# Patient Record
Sex: Male | Born: 1955 | Race: Black or African American | Hispanic: No | Marital: Married | Smoking: Never smoker
Health system: Southern US, Community
[De-identification: ages and names within clinical notes are randomized; demographics above are authoritative.]

## PROBLEM LIST (undated history)

## (undated) DIAGNOSIS — C801 Malignant (primary) neoplasm, unspecified: Secondary | ICD-10-CM

## (undated) DIAGNOSIS — E119 Type 2 diabetes mellitus without complications: Secondary | ICD-10-CM

## (undated) DIAGNOSIS — R519 Headache, unspecified: Secondary | ICD-10-CM

## (undated) HISTORY — PX: OTHER SURGICAL HISTORY: SHX169

---

## 2016-11-21 ENCOUNTER — Ambulatory Visit (HOSPITAL_COMMUNITY)
Admission: EM | Admit: 2016-11-21 | Discharge: 2016-11-21 | Disposition: A | Discharge status: Home / Self Care | Payer: Managed Care, Other (non HMO) | Attending: Physician Assistant | Admitting: Physician Assistant

## 2016-11-21 ENCOUNTER — Encounter (HOSPITAL_COMMUNITY): Payer: Self-pay | Admitting: Emergency Medicine

## 2016-11-21 DIAGNOSIS — Z113 Encounter for screening for infections with a predominantly sexual mode of transmission: Secondary | ICD-10-CM | POA: Diagnosis not present

## 2016-11-21 DIAGNOSIS — Z202 Contact with and (suspected) exposure to infections with a predominantly sexual mode of transmission: Secondary | ICD-10-CM | POA: Insufficient documentation

## 2016-11-21 MED ORDER — METRONIDAZOLE 500 MG PO TABS
ORAL_TABLET | ORAL | Status: AC
  Filled 2016-11-21: qty 4

## 2016-11-21 MED ORDER — METRONIDAZOLE 500 MG PO TABS
2000.0000 mg | ORAL_TABLET | Freq: Once | ORAL | Status: AC
  Administered 2016-11-21: 2000 mg via ORAL

## 2016-11-21 NOTE — ED Provider Notes (Signed)
Alexander Brady    CSN: [id number] Arrival date & time: 11/21/16  1146     History   Chief Complaint Chief Complaint  Patient presents with  . Exposure to STD    HPI Alexander Brady is a 61 y.o. male.   61 year-old male, presenting today for STD check. Wife is here with the patient and she states that she was told this morning she tested positive for trichomonas. Her symptoms started in the past week. Patient denies any penile discharge, rash or lesions. No abdominal pain, dysuria or flank pain The patient states he is only sexually active with his wife and the wife states the same    The history is provided by the patient and the spouse.  Exposure to STD  This is a new problem. The current episode started 1 to 2 hours ago. The problem occurs constantly. The problem has not changed since onset.Pertinent negatives include no chest pain, no abdominal pain, no headaches and no shortness of breath. Nothing aggravates the symptoms. Nothing relieves the symptoms. He has tried nothing for the symptoms. The treatment provided no relief.    History reviewed. No pertinent past medical history.  There are no active problems to display for this patient.   History reviewed. No pertinent surgical history.     Home Medications    Prior to Admission medications   Not on File    Family History No family history on file.  Social History Social History  Substance Use Topics  . Smoking status: Not on file  . Smokeless tobacco: Not on file  . Alcohol use Not on file     Allergies   Patient has no known allergies.   Review of Systems Review of Systems  Constitutional: Negative for chills and fever.  HENT: Negative for ear pain and sore throat.   Eyes: Negative for pain and visual disturbance.  Respiratory: Negative for cough and shortness of breath.   Cardiovascular: Negative for chest pain and palpitations.  Gastrointestinal: Negative for abdominal pain and  vomiting.  Genitourinary: Negative for dysuria and hematuria.  Musculoskeletal: Negative for arthralgias and back pain.  Skin: Negative for color change and rash.  Neurological: Negative for seizures, syncope and headaches.  All other systems reviewed and are negative.    Physical Exam Triage Vital Signs ED Triage Vitals [11/21/16 1306]  Enc Vitals Group     BP 134/80     Pulse Rate 73     Resp 16     Temp 98.5 F (36.9 C)     Temp Source Oral     SpO2 100 %     Weight      Height      Head Circumference      Peak Flow      Pain Score      Pain Loc      Pain Edu?      Excl. in [place]?    No data found.   Updated Vital Signs BP 134/80   Pulse 73   Temp 98.5 F (36.9 C) (Oral)   Resp 16   SpO2 100%   Visual Acuity Right Eye Distance:   Left Eye Distance:   Bilateral Distance:    Right Eye Near:   Left Eye Near:    Bilateral Near:     Physical Exam  Constitutional: He appears well-developed and well-nourished.  HENT:  Head: Normocephalic and atraumatic.  Eyes: Conjunctivae are normal.  Neck: Neck supple.  Cardiovascular:  Normal rate and regular rhythm.   No murmur heard. Pulmonary/Chest: Effort normal and breath sounds normal. No respiratory distress.  Abdominal: Soft. There is no tenderness.  Musculoskeletal: He exhibits no edema.  Neurological: He is alert.  Skin: Skin is warm and dry.  Psychiatric: He has a normal mood and affect.  Nursing note and vitals reviewed.    UC Treatments / Results  Labs (all labs ordered are listed, but only abnormal results are displayed) Labs Reviewed  URINE CYTOLOGY ANCILLARY ONLY    EKG  EKG Interpretation None       Radiology No results found.  Procedures Procedures (including critical care time)  Medications Ordered in UC Medications  metroNIDAZOLE (FLAGYL) tablet 2,000 mg (not administered)     Initial Impression / Assessment and Plan / UC Course  I have reviewed the triage vital signs and the  nursing notes.  Pertinent labs & imaging results that were available during my care of the patient were reviewed by me and considered in my medical decision making (see chart for details).     Patient requesting testing for trichomonas. He would like to be treated empirically while here. Given 2g of oral flagyl, one time dose. He is asymptomatic   Final Clinical Impressions(s) / UC Diagnoses   Final diagnoses:  Exposure to STD    New Prescriptions New Prescriptions   No medications on file     Controlled Substance Prescriptions [place] Controlled Substance Registry consulted? Not Applicable   Phebe Colla, [place] 11/21/16 1321

## 2016-11-21 NOTE — ED Triage Notes (Signed)
Pt states "i want to get checked for trichomonas". Pt denies symptoms.  Pt wife states she got a phone call that she has trichomonas.

## 2016-11-22 LAB — URINE CYTOLOGY ANCILLARY ONLY
Chlamydia: NEGATIVE
Neisseria Gonorrhea: NEGATIVE
Trichomonas: NEGATIVE

## 2017-04-04 ENCOUNTER — Encounter: Payer: Self-pay | Admitting: Internal Medicine

## 2017-04-24 ENCOUNTER — Ambulatory Visit: Payer: Managed Care, Other (non HMO)

## 2017-04-26 ENCOUNTER — Ambulatory Visit (INDEPENDENT_AMBULATORY_CARE_PROVIDER_SITE_OTHER): Payer: Self-pay

## 2017-04-26 DIAGNOSIS — Z1211 Encounter for screening for malignant neoplasm of colon: Secondary | ICD-10-CM

## 2017-04-26 MED ORDER — PEG 3350-KCL-NABCB-NACL-NASULF 236 G PO SOLR: 4000.0000 mL | Freq: Once | ORAL | 0 refills | Status: AC

## 2017-04-26 NOTE — Progress Notes (Signed)
Gastroenterology Pre-Procedure Review  Request Date:04/26/17 Requesting Physician: Thornton Papas NP [facility] ( pt stated he thinks he had a tcs "many many" years ago in [place] but hes not sure)  PATIENT REVIEW QUESTIONS: The patient responded to the following health history questions as indicated:    1. Diabetes Melitis: no 2. Joint replacements in the past 12 months: no 3. Major health problems in the past 3 months: no 4. Has an artificial valve or MVP: no 5. Has a defibrillator: no 6. Has been advised in past to take antibiotics in advance of a procedure like teeth cleaning: no 7. Family history of colon cancer: no  8. Alcohol Use: no 9. History of sleep apnea: no  10. History of coronary artery or other vascular stents placed within the last 12 months: no 11. History of any prior anesthesia complications: no    MEDICATIONS & ALLERGIES:    Patient reports the following regarding taking any blood thinners:   Plavix? no Aspirin? no Coumadin? no Brilinta? no Xarelto? no Eliquis? no Pradaxa? no Savaysa? no Effient? no  Patient confirms/reports the following medications:  No current outpatient medications on file.   No current facility-administered medications for this visit.     Patient confirms/reports the following allergies:  No Known Allergies  No orders of the defined types were placed in this encounter.   AUTHORIZATION INFORMATION Primary Insurance: [facility] / Auth required: yes Pre-Cert / Auth #: paperwork has been scanned in.    SCHEDULE INFORMATION: Procedure has been scheduled as follows:  Date: , Time:  Location: [facility]  This Gastroenterology Pre-Precedure Review Form is being routed to the following provider(s): Roseanne Kaufman NP

## 2017-04-26 NOTE — Progress Notes (Signed)
Patient has to check with his wife about dates and times and will call me back. See phone note.

## 2017-04-27 ENCOUNTER — Telehealth: Payer: Self-pay

## 2017-04-27 NOTE — Telephone Encounter (Signed)
Pt called asking for JL. He had dates to give her for his colonoscopy. I told him that JL was with another patient and would call him back if he would leave a VM for her. Pt refused. He said to have JL call him today at [phone]

## 2017-04-27 NOTE — Telephone Encounter (Signed)
Spoke with the pt, we discussed dates, he will call me back on Monday when he confirms with his wife. I have put him on the schedule for 06/15/17 at 10:30 with [facility]. I have not put the orders in yet. Rx will need to be faxed to the [place] and instructions mailed to the pt once he confirms this date and time.

## 2017-04-28 NOTE — Progress Notes (Signed)
Appropriate.

## 2017-05-02 NOTE — Telephone Encounter (Signed)
Spoke with the pt, date and time ok, instructions mailed to the pt and rx has been faxed to the [place]. Pt is aware.

## 2017-05-02 NOTE — Progress Notes (Signed)
Called pt- he is ok with the date and time we decided on and instructions are done and mailed to the pt. rx has been faxed to the [place]. Pt is aware.

## 2017-05-02 NOTE — Patient Instructions (Addendum)
Alexander Brady   Aug 07, 1955 MRN: [id number]    Procedure Date: 06/15/17 Time to register: 9:30am Place to register: [facility] Procedure Time: 10:30am Scheduled provider: Barney Drain, MD  PREPARATION FOR COLONOSCOPY WITH TRI-LYTE SPLIT PREP  Please notify us immediately if you are diabetic, take iron supplements, or if you are on Coumadin or any other blood thinners.     You will need to purchase 1 fleet enema and 1 box of Bisacodyl '5mg'$  tablets.   1 DAY BEFORE PROCEDURE:  DATE: 06/14/17   DAY: Wednesday  clear liquids the entire day - NO SOLID FOOD.     At 2:00 pm:  Take 2 Bisacodyl tablets.   At 4:00pm:  Start drinking your solution. Make sure you mix well per instructions on the bottle. Try to drink 1 (one) 8 ounce glass every 10-15 minutes until you have consumed HALF the jug. You should complete by 6:00pm.You must keep the left over solution refrigerated until completed next day.  Continue clear liquids. You must drink plenty of clear liquids to prevent dehyration and kidney failure. Nothing to eat or drink after midnight.  EXCEPTION: If you take medications for your heart, blood pressure or breathing, you may take these medications with a small amount of clear liquid.    DAY OF PROCEDURE:   DATE: 06/15/17   DAY: Thursday    Five hours before your procedure time @ 5:30am:  Finish remaining amout of bowel prep, drinking 1 (one) 8 ounce glass every 10-15 minutes until complete. You have two hours to consume remaining prep.   Three hours before your procedure time '@7'$ :30am:  Nothing by mouth.   At least one hour before going to the hospital:  Give yourself one Fleet enema. You may take your morning medications with sip of water unless we have instructed otherwise.      Please see below for Dietary Information.  CLEAR LIQUIDS INCLUDE:  Water Jello (NOT red in color)   Ice Popsicles (NOT red in color)   Tea (sugar ok, no milk/cream) Powdered fruit flavored  drinks  Coffee (sugar ok, no milk/cream) Gatorade/ Lemonade/ Kool-Aid  (NOT red in color)   Juice: apple, white grape, white cranberry Soft drinks  Clear bullion, consomme, broth (fat free beef/chicken/vegetable)  Carbonated beverages (any kind)  Strained chicken noodle soup Hard Candy   Remember: Clear liquids are liquids that will allow you to see your fingers on the other side of a clear glass. Be sure liquids are NOT red in color, and not cloudy, but CLEAR.  DO NOT EAT OR DRINK ANY OF THE FOLLOWING:  Dairy products of any kind   Cranberry juice Tomato juice / V8 juice   Grapefruit juice Orange juice     Red grape juice  Do not eat any solid foods, including such foods as: cereal, oatmeal, yogurt, fruits, vegetables, creamed soups, eggs, bread, crackers, pureed foods in a blender, etc.   HELPFUL HINTS FOR DRINKING PREP SOLUTION:   Make sure prep is extremely cold. Mix and refrigerate the the morning of the prep. You may also put in the freezer.   You may try mixing some Crystal Light or Country Time Lemonade if you prefer. Mix in small amounts; add more if necessary.  Try drinking through a straw  Rinse mouth with water or a mouthwash between glasses, to remove after-taste.  Try sipping on a cold beverage /ice/ popsicles between glasses of prep.  Place a piece of sugar-free hard candy in mouth  between glasses.  If you become nauseated, try consuming smaller amounts, or stretch out the time between glasses. Stop for 30-60 minutes, then slowly start back drinking.        OTHER INSTRUCTIONS  You will need a responsible adult at least 61 years of age to accompany you and drive you home. This person must remain in the waiting room during your procedure. The hospital will cancel your procedure if you do not have a responsible adult with you.   1. Wear loose fitting clothing that is easily removed. 2. Leave jewelry and other valuables at home.  3. Remove all body piercing  jewelry and leave at home. 4. Total time from sign-in until discharge is approximately 2-3 hours. 5. You should go home directly after your procedure and rest. You can resume normal activities the day after your procedure. 6. The day of your procedure you should not:  Drive  Make legal decisions  Operate machinery  Drink alcohol  Return to work   You may call the office (Dept: [phone]) before 5:00pm, or page the doctor on call [phone]) after 5:00pm, for further instructions, if necessary.   Insurance Information YOU WILL NEED TO CHECK WITH YOUR INSURANCE COMPANY FOR THE BENEFITS OF COVERAGE YOU HAVE FOR THIS PROCEDURE.  UNFORTUNATELY, NOT ALL INSURANCE COMPANIES HAVE BENEFITS TO COVER ALL OR PART OF THESE TYPES OF PROCEDURES.  IT IS YOUR RESPONSIBILITY TO CHECK YOUR BENEFITS, HOWEVER, WE WILL BE GLAD TO ASSIST YOU WITH ANY CODES YOUR INSURANCE COMPANY MAY NEED.    PLEASE NOTE THAT MOST INSURANCE COMPANIES WILL NOT COVER A SCREENING COLONOSCOPY FOR PEOPLE UNDER THE AGE OF 50  IF YOU HAVE BCBS INSURANCE, YOU MAY HAVE BENEFITS FOR A SCREENING COLONOSCOPY BUT IF POLYPS ARE FOUND THE DIAGNOSIS WILL CHANGE AND THEN YOU MAY HAVE A DEDUCTIBLE THAT WILL NEED TO BE MET. SO PLEASE MAKE SURE YOU CHECK YOUR BENEFITS FOR A SCREENING COLONOSCOPY AS WELL AS A DIAGNOSTIC COLONOSCOPY.

## 2017-05-02 NOTE — Addendum Note (Signed)
Addended by: Claudina Lick on: 05/02/2017 04:59 PM   Modules accepted: Orders, SmartSet

## 2017-06-15 ENCOUNTER — Other Ambulatory Visit: Payer: Self-pay

## 2017-06-15 ENCOUNTER — Ambulatory Visit (HOSPITAL_COMMUNITY)
Admission: RE | Admit: 2017-06-15 | Discharge: 2017-06-15 | Disposition: A | Discharge status: Home / Self Care | Payer: Non-veteran care | Source: Ambulatory Visit | Attending: Gastroenterology | Admitting: Gastroenterology

## 2017-06-15 ENCOUNTER — Encounter (HOSPITAL_COMMUNITY): Payer: Self-pay | Admitting: *Deleted

## 2017-06-15 ENCOUNTER — Encounter (HOSPITAL_COMMUNITY)
Admission: RE | Disposition: A | Discharge status: Home / Self Care | Payer: Self-pay | Source: Ambulatory Visit | Attending: Gastroenterology

## 2017-06-15 DIAGNOSIS — Z1211 Encounter for screening for malignant neoplasm of colon: Secondary | ICD-10-CM | POA: Diagnosis not present

## 2017-06-15 DIAGNOSIS — K644 Residual hemorrhoidal skin tags: Secondary | ICD-10-CM | POA: Insufficient documentation

## 2017-06-15 HISTORY — PX: COLONOSCOPY: SHX5424

## 2017-06-15 SURGERY — COLONOSCOPY
Anesthesia: Moderate Sedation

## 2017-06-15 MED ORDER — MIDAZOLAM HCL 5 MG/5ML IJ SOLN
INTRAMUSCULAR | Status: AC
  Filled 2017-06-15: qty 10

## 2017-06-15 MED ORDER — MEPERIDINE HCL 100 MG/ML IJ SOLN
INTRAMUSCULAR | Status: AC
  Filled 2017-06-15: qty 2

## 2017-06-15 MED ORDER — MEPERIDINE HCL 100 MG/ML IJ SOLN
INTRAMUSCULAR | Status: DC | PRN
  Administered 2017-06-15: 25 mg
  Administered 2017-06-15: 50 mg

## 2017-06-15 MED ORDER — STERILE WATER FOR IRRIGATION IR SOLN
Status: DC | PRN
  Administered 2017-06-15: 10:00:00

## 2017-06-15 MED ORDER — SODIUM CHLORIDE 0.9 % IV SOLN: INTRAVENOUS | Status: DC

## 2017-06-15 MED ORDER — MIDAZOLAM HCL 5 MG/5ML IJ SOLN
INTRAMUSCULAR | Status: DC | PRN
  Administered 2017-06-15 (×2): 2 mg via INTRAVENOUS

## 2017-06-15 MED ORDER — SODIUM CHLORIDE 0.9 % IV SOLN
INTRAVENOUS | Status: AC | PRN
  Administered 2017-06-15: 1000 mL via INTRAVENOUS

## 2017-06-15 NOTE — H&P (Signed)
  Primary Care Physician:  [facility] Primary Gastroenterologist:  Dr. Oneida Alar  Pre-Procedure History & Physical: HPI:  Alexander Brady is a 62 y.o. male here for [place].  History reviewed. No pertinent past medical history.  Past Surgical History:  Procedure Laterality Date  . NONE      Prior to Admission medications   Medication Sig Start Date End Date Taking? Authorizing Provider  Multiple Vitamins-Minerals (MULTIVITAMIN WITH MINERALS) tablet Take 1 tablet by mouth daily.   Yes [provider]    Allergies as of 05/02/2017  . (No Known Allergies)    Family History  Problem Relation Age of Onset  . Leukemia Mother   . Prostate cancer Brother   . Colon cancer Neg Hx   . Colon polyps Neg Hx     Social History   Socioeconomic History  . Marital status: Married    Spouse name: Not on file  . Number of children: Not on file  . Years of education: Not on file  . Highest education level: Not on file  Occupational History  . Not on file  Social Needs  . Financial resource strain: Not on file  . Food insecurity:    Worry: Not on file    Inability: Not on file  . Transportation needs:    Medical: Not on file    Non-medical: Not on file  Tobacco Use  . Smoking status: Never Smoker  . Smokeless tobacco: Never Used  Substance and Sexual Activity  . Alcohol use: Never    Frequency: Never  . Drug use: Never  . Sexual activity: Not on file  Lifestyle  . Physical activity:    Days per week: Not on file    Minutes per session: Not on file  . Stress: Not on file  Relationships  . Social connections:    Talks on phone: Not on file    Gets together: Not on file    Attends religious service: Not on file    Active member of club or organization: Not on file    Attends meetings of clubs or organizations: Not on file    Relationship status: Not on file  . Intimate partner violence:    Fear of current or ex partner: Not on file   Emotionally abused: Not on file    Physically abused: Not on file    Forced sexual activity: Not on file  Other Topics Concern  . Not on file  Social History Narrative  . Not on file    Review of Systems: See HPI, otherwise negative ROS   Physical Exam: BP 124/74   Pulse 61   Temp 98 F (36.7 C) (Oral)   Resp 13   SpO2 100%  General:   Alert,  pleasant and cooperative in NAD Head:  Normocephalic and atraumatic. Neck:  Supple; Lungs:  Clear throughout to auscultation.    Heart:  Regular rate and rhythm. Abdomen:  Soft, nontender and nondistended. Normal bowel sounds, without guarding, and without rebound.   Neurologic:  Alert and  oriented x4;  grossly normal neurologically.  Impression/Plan:     SCREENING  Plan:  1. TCS TODAY. DISCUSSED PROCEDURE, BENEFITS, & RISKS: < 1% chance of medication reaction, bleeding, perforation, or rupture of spleen/liver.

## 2017-06-15 NOTE — Op Note (Addendum)
[facility] Patient Name: Alexander Brady Procedure Date: 06/15/2017 10:22 AM MRN: [id number] Date of Birth: 1955-05-17 Attending MD: Barney Drain MD, MD CSN: [id number] Age: 62 Admit Type: Outpatient Procedure:                Colonoscopy, SCREENING Indications:              Screening for colorectal malignant neoplasm Providers:                Barney Drain MD, MD, Janeece Riggers, RN, Nelma Rothman,                            Technician Referring MD:             [facility] Medicines:                Meperidine 75 mg IV, Midazolam 4 mg IV Complications:            No immediate complications. Estimated Blood Loss:     Estimated blood loss: none. Procedure:                Pre-Anesthesia Assessment:                           - Prior to the procedure, a History and Physical                            was performed, and patient medications and                            allergies were reviewed. The patient's tolerance of                            previous anesthesia was also reviewed. The risks                            and benefits of the procedure and the sedation                            options and risks were discussed with the patient.                            All questions were answered, and informed consent                            was obtained. Prior Anticoagulants: The patient has                            taken no previous anticoagulant or antiplatelet                            agents. ASA Grade Assessment: I - A normal, healthy                            patient. After reviewing the risks and benefits,  the patient was deemed in satisfactory condition to                            undergo the procedure. After obtaining informed                            consent, the colonoscope was passed under direct                            vision. Throughout the procedure, the patient's                            blood pressure, pulse, and oxygen  saturations were                            monitored continuously. The EC-[id number] ([id number])                            scope was introduced through the anus and advanced                            to the the cecum, identified by appendiceal orifice                            and ileocecal valve. The colonoscopy was                            technically difficult and complex due to                            significant looping. Successful completion of the                            procedure was aided by straightening and shortening                            the scope to obtain bowel loop reduction and                            COLOWRAP. The patient tolerated the procedure                            fairly well. The quality of the bowel preparation                            was excellent. The ileocecal valve, appendiceal                            orifice, and rectum were photographed. Scope In: 10:36:58 AM Scope Out: 10:49:16 AM Scope Withdrawal Time: 0 hours 9 minutes 11 seconds  Total Procedure Duration: 0 hours 12 minutes 18 seconds  Findings:      The recto-sigmoid colon, sigmoid colon and descending colon revealed       moderately excessive looping.  The exam was otherwise without abnormality.      External hemorrhoids were found during retroflexion. The hemorrhoids       were moderate. Impression:               - There was significant looping of the colon.                           - The examination was otherwise normal.                           - External hemorrhoids. Moderate Sedation:      Moderate (conscious) sedation was administered by the endoscopy nurse       and supervised by the endoscopist. The following parameters were       monitored: oxygen saturation, heart rate, blood pressure, and response       to care. Total physician intraservice time was 29 minutes. Recommendation:           - Repeat colonoscopy in 10 years for surveillance                             WITH COLOWRAP.                           - High fiber diet.                           - Continue present medications.                           - Patient has a contact number available for                            emergencies. The signs and symptoms of potential                            delayed complications were discussed with the                            patient. Return to normal activities tomorrow.                            Written discharge instructions were provided to the                            patient. Procedure Code(s):        --- Professional ---                           [phone], Colonoscopy, flexible; diagnostic, including                            collection of specimen(s) by brushing or washing,                            when performed (separate procedure)  G0500, Moderate sedation services provided by the                            same physician or other qualified health care                            professional performing a gastrointestinal                            endoscopic service that sedation supports,                            requiring the presence of an independent trained                            observer to assist in the monitoring of the                            patient's level of consciousness and physiological                            status; initial 15 minutes of intra-service time;                            patient age 51 years or older (additional time may                            be reported with [phone], as appropriate)                           [id number], Moderate sedation services provided by the                            same physician or other qualified health care                            professional performing the diagnostic or                            therapeutic service that the sedation supports,                            requiring the presence of an independent trained                             observer to assist in the monitoring of the                            patient's level of consciousness and physiological                            status; each additional 15 minutes intraservice  time (List separately in addition to code for                            primary service) Diagnosis Code(s):        --- Professional ---                           Z12.11, Encounter for screening for malignant                            neoplasm of colon                           K64.4, Residual hemorrhoidal skin tags CPT copyright 2017 American Medical Association. All rights reserved. The codes documented in this report are preliminary and upon coder review may  be revised to meet current compliance requirements. Barney Drain, MD Barney Drain MD, MD 06/15/2017 10:58:38 AM This report has been signed electronically. Number of Addenda: 0

## 2017-06-15 NOTE — Discharge Instructions (Signed)
You have moderate size external hemorrhoids. YOU DID NOT HAVE ANY POLYPS.   DRINK WATER TO KEEP YOUR URINE LIGHT YELLOW.  To reduce your risk of colon cancer, FOLLOW A HIGH FIBER DIET. AVOID ITEMS THAT CAUSE BLOATING. SEE INFO BELOW.  USE PREPARATION H FOUR TIMES  A DAY IF NEEDED TO RELIEVE RECTAL PAIN/PRESSURE/BLEEDING.  Next colonoscopy in 10 years.  Colonoscopy Care After Read the instructions outlined below and refer to this sheet in the next week. These discharge instructions provide you with general information on caring for yourself after you leave the hospital. While your treatment has been planned according to the most current medical practices available, unavoidable complications occasionally occur. If you have any problems or questions after discharge, call DR. Amica Harron, [phone].  ACTIVITY  You may resume your regular activity, but move at a slower pace for the next 24 hours.   Take frequent rest periods for the next 24 hours.   Walking will help get rid of the air and reduce the bloated feeling in your belly (abdomen).   No driving for 24 hours (because of the medicine (anesthesia) used during the test).   You may shower.   Do not sign any important legal documents or operate any machinery for 24 hours (because of the anesthesia used during the test).    NUTRITION  Drink plenty of fluids.   You may resume your normal diet as instructed by your doctor.   Begin with a light meal and progress to your normal diet. Heavy or fried foods are harder to digest and may make you feel sick to your stomach (nauseated).   Avoid alcoholic beverages for 24 hours or as instructed.    MEDICATIONS  You may resume your normal medications.   WHAT YOU CAN EXPECT TODAY  Some feelings of bloating in the abdomen.   Passage of more gas than usual.   Spotting of blood in your stool or on the toilet paper  .  IF YOU HAD POLYPS REMOVED DURING THE COLONOSCOPY:  Eat a soft  diet IF YOU HAVE NAUSEA, BLOATING, ABDOMINAL PAIN, OR VOMITING.    FINDING OUT THE RESULTS OF YOUR TEST Not all test results are available during your visit. DR. Oneida Alar WILL CALL YOU WITHIN 14 DAYS OF YOUR PROCEDUE WITH YOUR RESULTS. Do not assume everything is normal if you have not heard from DR. Merrianne Mccumbers, CALL HER OFFICE AT [phone].  SEEK IMMEDIATE MEDICAL ATTENTION AND CALL THE OFFICE: [phone] IF:  You have more than a spotting of blood in your stool.   Your belly is swollen (abdominal distention).   You are nauseated or vomiting.   You have a temperature over 101F.   You have abdominal pain or discomfort that is severe or gets worse throughout the day.  High-Fiber Diet A high-fiber diet changes your normal diet to include more whole grains, legumes, fruits, and vegetables. Changes in the diet involve replacing refined carbohydrates with unrefined foods. The calorie level of the diet is essentially unchanged. The Dietary Reference Intake (recommended amount) for adult males is 38 grams per day. For adult females, it is 25 grams per day. Pregnant and lactating women should consume 28 grams of fiber per day. Fiber is the intact part of a plant that is not broken down during digestion. Functional fiber is fiber that has been isolated from the plant to provide a beneficial effect in the body. PURPOSE  Increase stool bulk.   Ease and regulate bowel movements.   Lower  cholesterol.   REDUCE RISK OF COLON CANCER  INDICATIONS THAT YOU NEED MORE FIBER  Constipation and hemorrhoids.   Uncomplicated diverticulosis (intestine condition) and irritable bowel syndrome.   Weight management.   As a protective measure against hardening of the arteries (atherosclerosis), diabetes, and cancer.   GUIDELINES FOR INCREASING FIBER IN THE DIET  Start adding fiber to the diet slowly. A gradual increase of about 5 more grams (2 slices of whole-wheat bread, 2 servings of most fruits or  vegetables, or 1 bowl of high-fiber cereal) per day is best. Too rapid an increase in fiber may result in constipation, flatulence, and bloating.   Drink enough water and fluids to keep your urine clear or pale yellow. Water, juice, or caffeine-free drinks are recommended. Not drinking enough fluid may cause constipation.   Eat a variety of high-fiber foods rather than one type of fiber.   Try to increase your intake of fiber through using high-fiber foods rather than fiber pills or supplements that contain small amounts of fiber.   The goal is to change the types of food eaten. Do not supplement your present diet with high-fiber foods, but replace foods in your present diet.    INCLUDE A VARIETY OF FIBER SOURCES  Replace refined and processed grains with whole grains, canned fruits with fresh fruits, and incorporate other fiber sources. White rice, white breads, and most bakery goods contain little or no fiber.   Brown whole-grain rice, buckwheat oats, and many fruits and vegetables are all good sources of fiber. These include: broccoli, Brussels sprouts, cabbage, cauliflower, beets, sweet potatoes, white potatoes (skin on), carrots, tomatoes, eggplant, squash, berries, fresh fruits, and dried fruits.   Cereals appear to be the richest source of fiber. Cereal fiber is found in whole grains and bran. Bran is the fiber-rich outer coat of cereal grain, which is largely removed in refining. In whole-grain cereals, the bran remains. In breakfast cereals, the largest amount of fiber is found in those with "bran" in their names. The fiber content is sometimes indicated on the label.   You may need to include additional fruits and vegetables each day.   In baking, for 1 cup white flour, you may use the following substitutions:   1 cup whole-wheat flour minus 2 tablespoons.   1/2 cup white flour plus 1/2 cup whole-wheat flour.   Hemorrhoids Hemorrhoids are dilated (enlarged) veins around the  rectum. Sometimes clots will form in the veins. This makes them swollen and painful. These are called thrombosed hemorrhoids. Causes of hemorrhoids include:  Constipation.   Straining to have a bowel movement.   HEAVY LIFTING  HOME CARE INSTRUCTIONS  Eat a well balanced diet and drink 6 to 8 glasses of water every day to avoid constipation. You may also use a bulk laxative.   Avoid straining to have bowel movements.   Keep anal area dry and clean.   Do not use a donut shaped pillow or sit on the toilet for long periods. This increases blood pooling and pain.   Move your bowels when your body has the urge; this will require less straining and will decrease pain and pressure.

## 2017-06-20 ENCOUNTER — Encounter (HOSPITAL_COMMUNITY): Payer: Self-pay | Admitting: Gastroenterology

## 2017-07-23 ENCOUNTER — Emergency Department (HOSPITAL_COMMUNITY)
Admission: EM | Admit: 2017-07-23 | Discharge: 2017-07-23 | Disposition: A | Discharge status: Home / Self Care | Payer: Managed Care, Other (non HMO) | Attending: Emergency Medicine | Admitting: Emergency Medicine

## 2017-07-23 ENCOUNTER — Encounter (HOSPITAL_COMMUNITY): Payer: Self-pay | Admitting: Emergency Medicine

## 2017-07-23 ENCOUNTER — Other Ambulatory Visit: Payer: Self-pay

## 2017-07-23 DIAGNOSIS — L089 Local infection of the skin and subcutaneous tissue, unspecified: Secondary | ICD-10-CM | POA: Insufficient documentation

## 2017-07-23 DIAGNOSIS — L910 Hypertrophic scar: Secondary | ICD-10-CM | POA: Insufficient documentation

## 2017-07-23 DIAGNOSIS — M542 Cervicalgia: Secondary | ICD-10-CM | POA: Diagnosis present

## 2017-07-23 MED ORDER — DOXYCYCLINE HYCLATE 100 MG PO TABS
100.0000 mg | ORAL_TABLET | Freq: Once | ORAL | Status: AC
  Administered 2017-07-23: 100 mg via ORAL
  Filled 2017-07-23: qty 1

## 2017-07-23 MED ORDER — CEFTRIAXONE SODIUM 1 G IJ SOLR
1.0000 g | Freq: Once | INTRAMUSCULAR | Status: AC
  Administered 2017-07-23: 1 g via INTRAMUSCULAR
  Filled 2017-07-23: qty 10

## 2017-07-23 MED ORDER — DOXYCYCLINE HYCLATE 100 MG PO CAPS: 100.0000 mg | ORAL_CAPSULE | Freq: Two times a day (BID) | ORAL | 0 refills | Status: DC

## 2017-07-23 NOTE — ED Provider Notes (Signed)
[facility] EMERGENCY DEPARTMENT Provider Note   CSN: [id number] Arrival date & time: 07/23/17  0818     History   Chief Complaint Chief Complaint  Patient presents with  . Neck Pain    HPI Alexander Brady is a 62 y.o. male.  Patient is a 62 year old male who presents to the emergency department with a complaint of problem with his neck.  The patient states that at age 44 he sustained a laceration to his neck.  He took this to the sutures out himself and did not allow it to completely heal, he developed keloid scarring.  And has had problems with it since that time.  Most recently he has been having issues with drainage and pain around the neck area.  He says at times the drainage has an odor to it.  He says there are at least 2 areas that seem to be draining on his neck mostly at the edges of the keloid scarring.  He has not had fever or chills.  He has had not had nausea or vomiting.  He has not noted any red streaks.     History reviewed. No pertinent past medical history.  Patient Active Problem List   Diagnosis Date Noted  . Special screening for malignant neoplasms, colon     Past Surgical History:  Procedure Laterality Date  . COLONOSCOPY N/A 06/15/2017   Procedure: COLONOSCOPY;  Surgeon: Danie Binder, MD;  Location: [facility];  Service: Endoscopy;  Laterality: N/A;  10:30  . NONE          Home Medications    Prior to Admission medications   Medication Sig Start Date End Date Taking? Authorizing Provider  Multiple Vitamins-Minerals (MULTIVITAMIN WITH MINERALS) tablet Take 1 tablet by mouth daily.   Yes [provider]    Family History Family History  Problem Relation Age of Onset  . Leukemia Mother   . Prostate cancer Brother   . Colon cancer Neg Hx   . Colon polyps Neg Hx     Social History Social History   Tobacco Use  . Smoking status: Never Smoker  . Smokeless tobacco: Never Used  Substance Use Topics  . Alcohol use: Never    Frequency: Never  . Drug use: Never     Allergies   Patient has no known allergies.   Review of Systems Review of Systems  Constitutional: Negative for activity change.       All ROS Neg except as noted in HPI  HENT: Negative for nosebleeds.   Eyes: Negative for photophobia and discharge.  Respiratory: Negative for cough, shortness of breath and wheezing.   Cardiovascular: Negative for chest pain and palpitations.  Gastrointestinal: Negative for abdominal pain and blood in stool.  Genitourinary: Negative for dysuria, frequency and hematuria.  Musculoskeletal: Positive for neck pain. Negative for arthralgias and back pain.  Skin: Negative.        Skin infection under a keloid scar  Neurological: Negative for dizziness, seizures and speech difficulty.  Psychiatric/Behavioral: Negative for confusion and hallucinations.     Physical Exam Updated Vital Signs BP 138/64 (BP Location: Left Arm)   Pulse 62   Temp 98.6 F (37 C) (Oral)   Resp 16   Ht 5\' 7"  (1.702 m)   Wt 77.1 kg (170 lb)   SpO2 99%   BMI 26.63 kg/m   Physical Exam  Constitutional: He is oriented to person, place, and time. He appears well-developed and well-nourished.  Non-toxic appearance.  HENT:  Head: Normocephalic.  Right Ear: Tympanic membrane and external ear normal.  Left Ear: Tympanic membrane and external ear normal.  Eyes: Pupils are equal, round, and reactive to light. EOM and lids are normal.  Neck: Normal range of motion. Neck supple. Carotid bruit is not present.    Patient has a very dense keloid scar at the neck area.  The area is warm, but not hot.  There are no red streaks appreciated.  There are 3 areas of drainage present.  (See diagram)  Cardiovascular: Normal rate, regular rhythm, normal heart sounds, intact distal pulses and normal pulses.  Pulmonary/Chest: Breath sounds normal. No respiratory distress.  Abdominal: Soft. Bowel sounds are normal. There is no tenderness. There is no  guarding.  Musculoskeletal: Normal range of motion.  Lymphadenopathy:       Head (right side): No submandibular adenopathy present.       Head (left side): No submandibular adenopathy present.    He has no cervical adenopathy.  Neurological: He is alert and oriented to person, place, and time. He has normal strength. No cranial nerve deficit or sensory deficit.  Skin: Skin is warm and dry.  Psychiatric: He has a normal mood and affect. His speech is normal.  Nursing note and vitals reviewed.    ED Treatments / Results  Labs (all labs ordered are listed, but only abnormal results are displayed) Labs Reviewed - No data to display  EKG None  Radiology No results found.  Procedures Procedures (including critical care time)  Medications Ordered in ED Medications - No data to display   Initial Impression / Assessment and Plan / ED Course  I have reviewed the triage vital signs and the nursing notes.  Pertinent labs & imaging results that were available during my care of the patient were reviewed by me and considered in my medical decision making (see chart for details).       Final Clinical Impressions(s) / ED Diagnoses MDM  Vital signs within normal limits.  Pulse oximetry is 99% on room air.  Within normal limits by my interpretation.  The patient has a long-term keloid scar.  He has 3 areas of drainage to at the edges of the keloid scar and one in the center.  The area is not hot and there are no red streaks appreciated.  Patient was treated with Rocephin in the emergency department.  A prescription for doxycycline has been given to the patient.  Patient has been referred to plastic surgery for additional evaluation.  I discussed with the patient that additional intervention concerning the keloid scar may be necessary if this infection continues to evolve.  Patient is in agreement with the plan.   Final diagnoses:  Skin infection  Keloid scar    ED Discharge Orders          Ordered    doxycycline (VIBRAMYCIN) 100 MG capsule  2 times daily     07/23/17 0937       Lily Kocher, PA-C 07/23/17 9518    Daleen Bo, MD 07/23/17 [phone]

## 2017-07-23 NOTE — ED Triage Notes (Signed)
C/o neck pain rating 6/10 with drainage from previous keloids.  history of keloids.

## 2017-07-23 NOTE — Discharge Instructions (Addendum)
Your vital signs within normal limits.  You have 3 areas of drainage from the keloid scar on your neck.  He will be treated with antibiotics.  Please cleanse the area daily with soap and water.  Please have this followed up next week by your primary care physician.  I am including 1 of the plastic surgeons in the [place] area on your discharge paperwork.  If this infection is not improving, you may have to have some intervention by plastic surgery to make sure that all the infection is removed from behind this scar.

## 2019-10-22 DIAGNOSIS — E785 Hyperlipidemia, unspecified: Secondary | ICD-10-CM | POA: Insufficient documentation

## 2019-10-22 DIAGNOSIS — I700 Atherosclerosis of aorta: Secondary | ICD-10-CM | POA: Insufficient documentation

## 2019-10-23 ENCOUNTER — Other Ambulatory Visit: Payer: Self-pay | Admitting: Internal Medicine

## 2019-10-23 ENCOUNTER — Other Ambulatory Visit (HOSPITAL_COMMUNITY): Payer: Self-pay | Admitting: Internal Medicine

## 2019-10-23 DIAGNOSIS — R0609 Other forms of dyspnea: Secondary | ICD-10-CM

## 2019-10-23 DIAGNOSIS — I25118 Atherosclerotic heart disease of native coronary artery with other forms of angina pectoris: Secondary | ICD-10-CM

## 2019-11-13 ENCOUNTER — Telehealth (HOSPITAL_COMMUNITY): Payer: Self-pay | Admitting: Emergency Medicine

## 2019-11-13 NOTE — Telephone Encounter (Signed)
Reaching out to patient to offer assistance regarding upcoming cardiac imaging study; pt verbalizes understanding of appt date/time, parking situation and where to check in, pre-test NPO status and medications ordered, and verified current allergies; name and call back number provided for further questions should they arise Alexander Bond RN Navigator Cardiac Imaging [facility] [phone] office [phone] cell  Pt needs istat  Pt verbalized understsanding to take 50mg  metoprolol succinate 2 hr prior to scan

## 2019-11-15 ENCOUNTER — Other Ambulatory Visit: Payer: Self-pay

## 2019-11-15 ENCOUNTER — Ambulatory Visit (HOSPITAL_COMMUNITY)
Admission: RE | Admit: 2019-11-15 | Discharge: 2019-11-15 | Disposition: A | Discharge status: Home / Self Care | Payer: Managed Care, Other (non HMO) | Source: Ambulatory Visit | Attending: Internal Medicine | Admitting: Internal Medicine

## 2019-11-15 DIAGNOSIS — R0609 Other forms of dyspnea: Secondary | ICD-10-CM

## 2019-11-15 DIAGNOSIS — R0600 Dyspnea, unspecified: Secondary | ICD-10-CM | POA: Insufficient documentation

## 2019-11-15 DIAGNOSIS — I25118 Atherosclerotic heart disease of native coronary artery with other forms of angina pectoris: Secondary | ICD-10-CM

## 2019-11-15 LAB — POCT I-STAT CREATININE: Creatinine, Ser: 0.9 mg/dL (ref 0.61–1.24)

## 2019-11-15 MED ORDER — NITROGLYCERIN 0.4 MG SL SUBL
SUBLINGUAL_TABLET | SUBLINGUAL | Status: AC
  Filled 2019-11-15: qty 2

## 2019-11-15 MED ORDER — IOHEXOL 350 MG/ML SOLN
80.0000 mL | Freq: Once | INTRAVENOUS | Status: AC | PRN
  Administered 2019-11-15: 80 mL via INTRAVENOUS

## 2019-11-15 MED ORDER — NITROGLYCERIN 0.4 MG SL SUBL
0.8000 mg | SUBLINGUAL_TABLET | Freq: Once | SUBLINGUAL | Status: AC
  Administered 2019-11-15: 0.8 mg via SUBLINGUAL

## 2020-01-28 DIAGNOSIS — I10 Essential (primary) hypertension: Secondary | ICD-10-CM | POA: Insufficient documentation

## 2020-02-28 DIAGNOSIS — R7303 Prediabetes: Secondary | ICD-10-CM | POA: Insufficient documentation

## 2020-02-28 DIAGNOSIS — R972 Elevated prostate specific antigen [PSA]: Secondary | ICD-10-CM | POA: Insufficient documentation

## 2020-02-28 DIAGNOSIS — L910 Hypertrophic scar: Secondary | ICD-10-CM | POA: Insufficient documentation

## 2020-04-02 ENCOUNTER — Other Ambulatory Visit: Payer: Self-pay

## 2020-04-02 ENCOUNTER — Encounter (HOSPITAL_COMMUNITY)
Admission: RE | Admit: 2020-04-02 | Discharge: 2020-04-02 | Disposition: A | Discharge status: Home / Self Care | Payer: No Typology Code available for payment source | Source: Ambulatory Visit | Attending: Ophthalmology | Admitting: Ophthalmology

## 2020-04-08 ENCOUNTER — Other Ambulatory Visit (HOSPITAL_COMMUNITY)
Admission: RE | Admit: 2020-04-08 | Discharge: 2020-04-08 | Disposition: A | Discharge status: Home / Self Care | Payer: No Typology Code available for payment source | Source: Ambulatory Visit | Attending: Ophthalmology | Admitting: Ophthalmology

## 2020-04-10 ENCOUNTER — Ambulatory Visit: Admit: 2020-04-10 | Payer: Non-veteran care | Admitting: Ophthalmology

## 2020-04-10 SURGERY — PHACOEMULSIFICATION, CATARACT, WITH IOL INSERTION
Anesthesia: Monitor Anesthesia Care | Laterality: Right

## 2020-10-08 ENCOUNTER — Ambulatory Visit: Payer: Non-veteran care | Admitting: Urology

## 2020-10-09 ENCOUNTER — Telehealth: Payer: Self-pay

## 2020-10-09 ENCOUNTER — Encounter: Payer: Self-pay | Admitting: Urology

## 2020-10-09 ENCOUNTER — Ambulatory Visit (INDEPENDENT_AMBULATORY_CARE_PROVIDER_SITE_OTHER): Payer: Managed Care, Other (non HMO) | Admitting: Urology

## 2020-10-09 ENCOUNTER — Other Ambulatory Visit: Payer: Self-pay

## 2020-10-09 ENCOUNTER — Ambulatory Visit: Payer: Non-veteran care | Admitting: Urology

## 2020-10-09 DIAGNOSIS — N5201 Erectile dysfunction due to arterial insufficiency: Secondary | ICD-10-CM | POA: Diagnosis not present

## 2020-10-09 DIAGNOSIS — R972 Elevated prostate specific antigen [PSA]: Secondary | ICD-10-CM | POA: Diagnosis not present

## 2020-10-09 LAB — URINALYSIS, ROUTINE W REFLEX MICROSCOPIC
Bilirubin, UA: NEGATIVE
Glucose, UA: NEGATIVE
Ketones, UA: NEGATIVE
Leukocytes,UA: NEGATIVE
Nitrite, UA: NEGATIVE
Protein,UA: NEGATIVE
Specific Gravity, UA: >1.03 — ABNORMAL HIGH (ref 1.005–1.030)
Urobilinogen, Ur: 0.2 mg/dL (ref 0.2–1.0)
pH, UA: 5.5 (ref 5.0–7.5)

## 2020-10-09 LAB — BLADDER SCAN AMB NON-IMAGING: Scan Result: 67

## 2020-10-09 LAB — MICROSCOPIC EXAMINATION
Bacteria, UA: NONE SEEN
Epithelial Cells (non renal): NONE SEEN /hpf (ref 0–10)
RBC, Urine: NONE SEEN /hpf (ref 0–2)
Renal Epithel, UA: NONE SEEN /hpf
WBC, UA: NONE SEEN /hpf (ref 0–5)

## 2020-10-09 MED ORDER — TADALAFIL 20 MG PO TABS: 20.0000 mg | ORAL_TABLET | Freq: Every day | ORAL | 5 refills | Status: DC | PRN

## 2020-10-09 MED ORDER — LEVOFLOXACIN 750 MG PO TABS: 750.0000 mg | ORAL_TABLET | Freq: Once | ORAL | 0 refills | Status: AC

## 2020-10-09 NOTE — Telephone Encounter (Signed)
Pharmacy calling about pt's medication that was called in.  There is a drug interaction.  Call back:  [phone]  Please advise.  Thanks, Helene Kelp

## 2020-10-09 NOTE — Telephone Encounter (Signed)
Pharmacist states that the combination of the Imdur that the patient takes daily along with the new rx of Cialis can cause severe hypotension. Please advise.

## 2020-10-09 NOTE — Progress Notes (Signed)
post void residual=67  Urological Symptom Review  Patient is experiencing the following symptoms: Frequent urination Hard to postpone urination Get up at night to urinate Leakage of urine Stream starts and stops Erection problems (male only)   Review of Systems  Gastrointestinal (upper)  : Nausea  Gastrointestinal (lower) : Negative for lower GI symptoms  Constitutional : Night Sweats  Skin: Negative for skin symptoms  Eyes: Negative for eye symptoms  Ear/Nose/Throat : Negative for Ear/Nose/Throat symptoms  Hematologic/Lymphatic: Negative for Hematologic/Lymphatic symptoms  Cardiovascular : Negative for cardiovascular symptoms  Respiratory : Negative for respiratory symptoms  Endocrine: Negative for endocrine symptoms  Musculoskeletal: Back pain  Neurological: Headaches  Psychologic: Negative for psychiatric symptoms

## 2020-10-09 NOTE — Patient Instructions (Addendum)
Transrectal Ultrasound-Guided Prostate Biopsy This is a procedure to take samples of tissue from your prostate. Ultrasound images are used to guide the procedure. It is usually done to check for prostate cancer. What happens before the procedure? Eating and drinking restrictions Follow instructions from your doctor about what you can eat or drink. Medicines Ask your doctor about changing or stopping: Your normal medicines. Vitamins, herbs, and supplements. Over-the-counter medicines. Do not take aspirin or ibuprofen unless you are told to. General instructions Liquid will be used to clear waste from your butt (enema). You may have a blood sample taken. You may have a pee (urine) sample taken. Plan to have a responsible adult take you home from the hospital or clinic. If you will be going home right after the procedure, plan to have a responsible adult care for you for the time you are told. This is important. For your safety, your doctor may: Ask you to wash with a soap that kills germs. Give you antibiotic medicine. What happens during the procedure?  You will be given one or both of these: A medicine to help you relax. A medicine to numb the area. You will be placed on your left side. Your knees may be bent. A probe with gel on it will be placed in your butt. Pictures will be taken of your prostate and the area around it. Medicine will be used to numb your prostate. A needle will be placed in your butt and moved to your prostate. Prostate tissue will be removed. The samples will be sent to a lab. The procedure may vary. What happens after the procedure? You will be watched until the medicines you were given have worn off. You may have some pain in your butt. You will be given medicine for it. Do not drive for 24 hours if you were given a medicine to help you relax. Summary This procedure is usually done to check for prostate cancer. Before the procedure, ask your doctor about  changing or stopping your medicines. You may have some pain in your butt. You will be given medicine for it. Plan to have a responsible adult take you home from the hospital or clinic. This information is not intended to replace advice given to you by your health care provider. Make sure you discuss any questions you have with your health care provider. Document Revised: 10/25/2019 Document Reviewed: 09/27/2019 Elsevier Patient Education  [place].     Appointment Time:12:30p Please arrive by 12:15p Appointment Date:11/18/2020  Location: [facility]   Prostate Biopsy Instructions  Stop all aspirin or blood thinners (aspirin, plavix, coumadin, warfarin, motrin, ibuprofen, advil, aleve, naproxen, naprosyn) for 7 days prior to the procedure.  If you have any questions about stopping these medications, please contact your primary care physician or cardiologist.  Having a light meal prior to the procedure is recommended.  If you are diabetic or have low blood sugar please bring a small snack or glucose tablet.  A Fleets enema is needed to be purchased over the counter at a local pharmacy and used 2 hours before you scheduled appointment.  This can be purchased over the counter at any pharmacy.  Antibiotics will be administered in the clinic at the time of the procedure and 1 tablet has been sent to your pharmacy. Please take the antibiotic as prescribed.    Please bring someone with you to the procedure to drive you home if you are given a valium to take prior  to your procedure.   If you have any questions or concerns, please feel free to call the office at [phone] or send a Mychart message.    Thank you, [facility]

## 2020-10-09 NOTE — Progress Notes (Signed)
10/09/2020 11:30 AM   Alexander Brady 1955-10-29 [id number]  Referring provider: [facility], [place],  [place] [address]  Elevated PSA  HPI: Alexander Brady is a 65yo here for evaluation of elevated PSA. PSA was 4.1 last year and increased to 5.3 in 02/2020. He has a brother age 32 with metastatic prostate cancer. IPSS 9 QOL 3 on no BPH therapy. No dysuria or hematuria. No bone or flank pain.  He has issues getting and maintaining an erection. His erections are semifirm and not firm enough for penetration.  He has good libido. Good exercise tolerance. No other complaints    PMH: No past medical history on file.  Surgical History: Past Surgical History:  Procedure Laterality Date   COLONOSCOPY N/A 06/15/2017   Procedure: COLONOSCOPY;  Surgeon: Alexander Binder, MD;  Location[facility];  Service: Endoscopy;  Laterality: N/A;  10:30   NONE      Home Medications:  Allergies as of 10/09/2020   No Known Allergies      Medication List        Accurate as of October 09, 2020 11:30 AM. If you have any questions, ask your nurse or doctor.          isosorbide mononitrate 30 MG 24 hr tablet Commonly known as: IMDUR Take by mouth.   metFORMIN 1000 MG tablet Commonly known as: GLUCOPHAGE Take 1,000 mg by mouth 2 (two) times daily.   multivitamin with minerals tablet Take 1 tablet by mouth daily.        Allergies: No Known Allergies  Family History: Family History  Problem Relation Age of Onset   Leukemia Mother    Prostate cancer Brother    Colon cancer Neg Hx    Colon polyps Neg Hx     Social History:  reports that he has never smoked. He has never used smokeless tobacco. He reports that he does not drink alcohol and does not use drugs.  ROS: All other review of systems were reviewed and are negative except what is noted above in HPI  Physical Exam: There were no vitals taken for this visit.  Constitutional:  Alert and oriented, No  acute distress. HEENT: Alexander Brady AT, moist mucus membranes.  Trachea midline, no masses. Cardiovascular: No clubbing, cyanosis, or edema. Respiratory: Normal respiratory effort, no increased work of breathing. GI: Abdomen is soft, nontender, nondistended, no abdominal masses GU: No CVA tenderness. Circumcised phallus. No masses/lesions on penis, testis, scrotum. Prostate 40g smooth no nodules no induration.  Lymph: No cervical or inguinal lymphadenopathy. Skin: No rashes, bruises or suspicious lesions. Neurologic: Grossly intact, no focal deficits, moving all 4 extremities. Psychiatric: Normal mood and affect.  Laboratory Data: No results found for: WBC, HGB, HCT, MCV, PLT  Lab Results  Component Value Date   CREATININE 0.90 11/15/2019    No results found for: PSA  No results found for: TESTOSTERONE  No results found for: HGBA1C  Urinalysis No results found for: COLORURINE, APPEARANCEUR, LABSPEC, PHURINE, GLUCOSEU, HGBUR, BILIRUBINUR, KETONESUR, PROTEINUR, UROBILINOGEN, NITRITE, LEUKOCYTESUR  No results found for: LABMICR, WBCUA, RBCUA, LABEPIT, MUCUS, BACTERIA  Pertinent Imaging:  No results found for this or any previous visit.  No results found for this or any previous visit.  No results found for this or any previous visit.  No results found for this or any previous visit.  No results found for this or any previous visit.  No results found for this or any previous visit.  No results found for this or any previous visit.  No results found for this or any previous visit.   Assessment & Plan:    1. Elevated PSA The patient and I talked about etiologies of elevated PSA.  We discussed the possible relationship between elevated PSA, prostate cancer, BPH, prostatitis, and UTI.   Conservative treatment of elevated PSA with watchful waiting was discussed with the patient.  All questions were answered.        All of the risks and benefits along with alternatives to prostate  biopsy were discussed with the patient.  The patient gave fully informed consent to proceed with a transrectal ultrasound guided biopsy of the prostate for the evaluation of their evated PSA.  Prostate biopsy instructions and antibiotics were given to the patient.  - Urinalysis, Routine w reflex microscopic - BLADDER SCAN AMB NON-IMAGING  2. Erectile dysfunction -Tadalafil '20mg'$  prn   No follow-ups on file.  Alexander Bang, MD  [facility] [place]

## 2020-10-13 ENCOUNTER — Other Ambulatory Visit: Payer: Self-pay

## 2020-10-13 NOTE — Telephone Encounter (Signed)
Pharmacy called and made aware. Medication discontinued.

## 2020-11-18 ENCOUNTER — Ambulatory Visit (INDEPENDENT_AMBULATORY_CARE_PROVIDER_SITE_OTHER): Payer: Managed Care, Other (non HMO) | Admitting: Urology

## 2020-11-18 ENCOUNTER — Encounter: Payer: Self-pay | Admitting: Urology

## 2020-11-18 ENCOUNTER — Other Ambulatory Visit: Payer: Self-pay

## 2020-11-18 ENCOUNTER — Encounter (HOSPITAL_COMMUNITY): Payer: Self-pay

## 2020-11-18 ENCOUNTER — Ambulatory Visit (HOSPITAL_COMMUNITY)
Admission: RE | Admit: 2020-11-18 | Discharge: 2020-11-18 | Disposition: A | Discharge status: Home / Self Care | Payer: Managed Care, Other (non HMO) | Source: Ambulatory Visit | Attending: Urology | Admitting: Urology

## 2020-11-18 ENCOUNTER — Telehealth: Payer: Self-pay

## 2020-11-18 ENCOUNTER — Other Ambulatory Visit: Payer: Self-pay | Admitting: Urology

## 2020-11-18 DIAGNOSIS — C61 Malignant neoplasm of prostate: Secondary | ICD-10-CM | POA: Diagnosis not present

## 2020-11-18 DIAGNOSIS — R972 Elevated prostate specific antigen [PSA]: Secondary | ICD-10-CM | POA: Diagnosis present

## 2020-11-18 MED ORDER — LIDOCAINE HCL (PF) 2 % IJ SOLN: 10.0000 mL | Freq: Once | INTRAMUSCULAR | Status: AC

## 2020-11-18 MED ORDER — LIDOCAINE HCL (PF) 2 % IJ SOLN
INTRAMUSCULAR | Status: AC
  Administered 2020-11-18: 13:00:00 10 mL
  Filled 2020-11-18: qty 10

## 2020-11-18 MED ORDER — GENTAMICIN SULFATE 40 MG/ML IJ SOLN: 80.0000 mg | Freq: Once | INTRAMUSCULAR | Status: AC

## 2020-11-18 MED ORDER — GENTAMICIN SULFATE 40 MG/ML IJ SOLN
INTRAMUSCULAR | Status: AC
  Administered 2020-11-18: 12:00:00 80 mg via INTRAMUSCULAR
  Filled 2020-11-18: qty 2

## 2020-11-18 NOTE — Telephone Encounter (Signed)
Returned patient call- patient was confirming location of prostate biopsy. Patient is Alexander Brady at Radiology.

## 2020-11-18 NOTE — Progress Notes (Signed)
Prostate Biopsy Procedure   Informed consent was obtained after discussing risks/benefits of the procedure.  A time out was performed to ensure correct patient identity.  Pre-Procedure: - Last PSA Level: No results found for: PSA - Gentamicin given prophylactically - Levaquin 500 mg administered PO -Transrectal Ultrasound performed revealing a 110.9 gm prostate -No significant hypoechoic or median lobe noted  Procedure: - Prostate block performed using 10 cc 1% lidocaine and biopsies taken from sextant areas, a total of 12 under ultrasound guidance.  Post-Procedure: - Patient tolerated the procedure well - He was counseled to seek immediate medical attention if experiences any severe pain, significant bleeding, or fevers - Return in one week to discuss biopsy results

## 2020-11-18 NOTE — Telephone Encounter (Signed)
Left a Voice Message:   Needing a call back about procedure at 12:30 today.  Please call asap to [phone]  Thanks, Helene Kelp

## 2020-11-18 NOTE — Progress Notes (Signed)
PT tolerated prostate biopsy procedure and antibiotic injection well today. Labs obtained and sent for pathology. PT ambulatory at discharge with no acute distress noted and verbalized understanding of discharge instructions. PT to follow up with urologist as scheduled on 11/25/20 at 3:45 and reminded of upcoming appointment.

## 2020-11-18 NOTE — Patient Instructions (Signed)
Transrectal Ultrasound-Guided Prostate Biopsy, Care After This sheet gives you information about how to care for yourself after your procedure. Your health care provider may also give you more specific instructions. If you have problems or questions, contact your health care provider. What can I expect after the procedure? After the procedure, it is common to have: Pain and discomfort near your rectum, especially while sitting. Pink-colored urine due to small amounts of blood in your urine. A burning feeling while urinating. Blood in your stool (feces) or bleeding from your rectum. Blood in your semen. Follow these instructions at home: Medicines Take over-the-counter and prescription medicines only as told by your health care provider. If you were prescribed antibiotic medicine, take it as told by your health care provider. Do not stop taking the antibiotic even if you start to feel better. Activity Do not drive for 24 hours if you were given a medicine to help you relax (sedative) during your procedure. Return to your normal activities as told by your health care provider. Ask your health care provider what activities are safe for you. Ask your health care provider when it is okay for you to resume sexual activity. Do not lift anything that is heavier than 10 lb (4.5 kg), or the limit that you are told, until your health care provider says that it is safe. General instructions  Drink enough water to keep your urine pale yellow. Watch your urine, stool, and semen for new or increased bleeding. Keep all follow-up visits as told by your health care provider. This is important. Contact a health care provider if: You have any of the following: Blood clots in your urine or stool. Blood in your urine more than 2 weeks after the procedure. Blood in your semen more than 2 months after the procedure. New or increased bleeding in your urine, stool, or semen. Severe pain in your abdomen. Your  urine smells bad or unusual. You have trouble urinating. Your lower abdomen feels firm. You have problems getting an erection. You have nausea or you vomit. Get help right away if: You have a fever or chills. This could be a sign of infection. You have bright red urine. You have severe pain that does not get better with medicine. You cannot urinate. Summary After this procedure, it is common to have pain and discomfort around your rectum, especially while sitting. You may have blood in your urine and stool after the procedure. It is common to have blood in your semen for 1-2 months after this procedure. If you were prescribed antibiotic medicine, take it as told by your health care provider. Do not stop taking the antibiotic even if you start to feel better. Get help right away if you have a fever or chills. This could be a sign of infection. This information is not intended to replace advice given to you by your health care provider. Make sure you discuss any questions you have with your health care provider. Document Revised: 10/25/2019 Document Reviewed: 09/26/2019 Elsevier Patient Education  2022 [employer].

## 2020-11-25 ENCOUNTER — Encounter: Payer: Self-pay | Admitting: Urology

## 2020-11-25 ENCOUNTER — Other Ambulatory Visit: Payer: Self-pay

## 2020-11-25 ENCOUNTER — Ambulatory Visit (INDEPENDENT_AMBULATORY_CARE_PROVIDER_SITE_OTHER): Payer: Managed Care, Other (non HMO) | Admitting: Urology

## 2020-11-25 DIAGNOSIS — C61 Malignant neoplasm of prostate: Secondary | ICD-10-CM

## 2020-11-25 NOTE — Progress Notes (Signed)
11/25/2020 4:24 PM   Alexander Brady 03/22/1955 [id number]  Referring provider: [facility], [place] [address]  Followup prostate biopsy   HPI: Alexander Brady is a 65yo here for followup after prostate biopsy. Biopsy revealed Gleason 3+4=7 in 4/12 cores and Gleason 3+3=6 in 1/12 cores. PSA 5.3 He has mild LUTS on no BPH therapy. Prostate volume 110cc. He has moderate ED with PDE5s.    PMH: No past medical history on file.  Surgical History: Past Surgical History:  Procedure Laterality Date   COLONOSCOPY N/A 06/15/2017   Procedure: COLONOSCOPY;  Surgeon: Alexander Binder, MD;  [facility];  Service: Endoscopy;  Laterality: N/A;  10:30   NONE      Home Medications:  Allergies as of 11/25/2020   No Known Allergies      Medication List        Accurate as of November 25, 2020  4:24 PM. If you have any questions, ask your nurse or doctor.          isosorbide mononitrate 30 MG 24 hr tablet Commonly known as: IMDUR Take by mouth.   metFORMIN 1000 MG tablet Commonly known as: GLUCOPHAGE Take 1,000 mg by mouth 2 (two) times daily.   multivitamin with minerals tablet Take 1 tablet by mouth daily.        Allergies: No Known Allergies  Family History: Family History  Problem Relation Age of Onset   Leukemia Mother    Prostate cancer Brother    Colon cancer Neg Hx    Colon polyps Neg Hx     Social History:  reports that he has never smoked. He has never used smokeless tobacco. He reports that he does not drink alcohol and does not use drugs.  ROS: All other review of systems were reviewed and are negative except what is noted above in HPI  Physical Exam: There were no vitals taken for this visit.  Constitutional:  Alert and oriented, No acute distress. HEENT: [place] AT, moist mucus membranes.  Trachea midline, no masses. Cardiovascular: No clubbing, cyanosis, or edema. Respiratory: Normal respiratory effort, no  increased work of breathing. GI: Abdomen is soft, nontender, nondistended, no abdominal masses GU: No CVA tenderness.  Lymph: No cervical or inguinal lymphadenopathy. Skin: No rashes, bruises or suspicious lesions. Neurologic: Grossly intact, no focal deficits, moving all 4 extremities. Psychiatric: Normal mood and affect.  Laboratory Data: No results found for: WBC, HGB, HCT, MCV, PLT  Lab Results  Component Value Date   CREATININE 0.90 11/15/2019    No results found for: PSA  No results found for: TESTOSTERONE  No results found for: HGBA1C  Urinalysis    Component Value Date/Time   APPEARANCEUR Clear 10/09/2020 1125   GLUCOSEU Negative 10/09/2020 1125   BILIRUBINUR Negative 10/09/2020 1125   PROTEINUR Negative 10/09/2020 1125   NITRITE Negative 10/09/2020 1125   LEUKOCYTESUR Negative 10/09/2020 1125    Lab Results  Component Value Date   LABMICR See below: 10/09/2020   WBCUA None seen 10/09/2020   LABEPIT None seen 10/09/2020   BACTERIA None seen 10/09/2020    Pertinent Imaging:  No results found for this or any previous visit.  No results found for this or any previous visit.  No results found for this or any previous visit.  No results found for this or any previous visit.  No results found for this or any previous visit.  No results found for this or any previous  visit.  No results found for this or any previous visit.  No results found for this or any previous visit.   Assessment & Plan:    1. Prostate cancer [facility]) I discussed the natural history of favorable intermediate risk prostate cancer with the patient and the various treatment options including active surveillance, RALP, IMRT, brachytherapy, cryotherapy, HIFU and ADT.  I will have him see Dr. Tresa Brady for consideration of RALP and Dr. Tammi Brady for consideration of IMRT. We will obtain a CT and bone scan prior to his appointments.    No follow-ups on file.  Alexander Bang, MD  [facility] [place]

## 2020-11-26 LAB — BASIC METABOLIC PANEL
BUN/Creatinine Ratio: 25 — ABNORMAL HIGH (ref 10–24)
BUN: 19 mg/dL (ref 8–27)
CO2: 23 mmol/L (ref 20–29)
Calcium: 9.3 mg/dL (ref 8.6–10.2)
Chloride: 104 mmol/L (ref 96–106)
Creatinine, Ser: 0.75 mg/dL — ABNORMAL LOW (ref 0.76–1.27)
Glucose: 106 mg/dL — ABNORMAL HIGH (ref 70–99)
Potassium: 4.4 mmol/L (ref 3.5–5.2)
Sodium: 139 mmol/L (ref 134–144)
eGFR: 101 mL/min/{1.73_m2} (ref >59)

## 2020-12-04 ENCOUNTER — Telehealth: Payer: Self-pay | Admitting: Urology

## 2020-12-04 NOTE — Telephone Encounter (Signed)
Dr. Alyson Ingles, Alexander Brady has declined the [phone] CT, Abd & Pelv (combo), the clinical review team states they will only authorize a MRI pelvis w & w/o contrast. Please advise?  Thank you.

## 2020-12-08 NOTE — Telephone Encounter (Signed)
I am going to have [place] scan the denial from [place] in the chart and it will be in the media section.

## 2020-12-10 NOTE — Telephone Encounter (Signed)
Not sure what to do? Change to a MRI or set up a peer to peer? Please advise.

## 2020-12-13 DIAGNOSIS — C61 Malignant neoplasm of prostate: Secondary | ICD-10-CM | POA: Insufficient documentation

## 2020-12-13 NOTE — Progress Notes (Signed)
Radiation Oncology         [phone] ________________________________  Initial Outpatient Consultation  Name: Alexander Brady MRN: [id number]  Date: 12/14/2020  DOB: 16-Mar-1955  QA:[id number], [facility]  McKenzie, Candee Furbish, MD   REFERRING PHYSICIAN: Cleon Gustin, MD  DIAGNOSIS: 65 y.o. gentleman with Stage T1c adenocarcinoma of the prostate with Gleason score of 3+4, and PSA of 5.3.    ICD-10-CM   1. Malignant neoplasm of prostate ([place])  C61 Ambulatory referral to Social Work      HISTORY OF PRESENT ILLNESS: Alexander Brady is a 65 y.o. male with a diagnosis of prostate cancer. He was noted to have an elevated PSA of 5.3 by his primary care physician at the [facility].  Accordingly, he was referred for evaluation in urology by Dr. Alyson Ingles on 10/09/20,  digital rectal examination was performed at that time revealing a 40 gm gland with no nodules.  The patient proceeded to transrectal ultrasound with 12 biopsies of the prostate on 11/18/20.  The prostate volume measured 110.9 cc.  Out of 12 core biopsies, 5 were positive.  The maximum Gleason score was 3+4, and this was seen in the distribution displayed below.    The patient reviewed the biopsy results with his urologist and he has kindly been referred today for discussion of potential radiation treatment options.   PREVIOUS RADIATION THERAPY: No  PAST MEDICAL HISTORY: No past medical history on file.    PAST SURGICAL HISTORY: Past Surgical History:  Procedure Laterality Date   COLONOSCOPY N/A 06/15/2017   Procedure: COLONOSCOPY;  Surgeon: Danie Binder, MD;  [facility];  Service: Endoscopy;  Laterality: N/A;  10:30   NONE      FAMILY HISTORY:  Family History  Problem Relation Age of Onset   Leukemia Mother    Prostate cancer Brother    Colon cancer Neg Hx    Colon polyps Neg Hx     SOCIAL HISTORY:  Social History   Socioeconomic History   Marital status: Married    Spouse name: Not on file    Number of children: Not on file   Years of education: Not on file   Highest education level: Not on file  Occupational History   Not on file  Tobacco Use   Smoking status: Never   Smokeless tobacco: Never  Vaping Use   Vaping Use: Never used  Substance and Sexual Activity   Alcohol use: Never   Drug use: Never   Sexual activity: Not on file  Other Topics Concern   Not on file  Social History Narrative   Not on file   Social Determinants of Health   Financial Resource Strain: Not on file  Food Insecurity: Not on file  Transportation Needs: Not on file  Physical Activity: Not on file  Stress: Not on file  Social Connections: Not on file  Intimate Partner Violence: Not on file    ALLERGIES: Patient has no known allergies.  MEDICATIONS:  Current Outpatient Medications  Medication Sig Dispense Refill   isosorbide mononitrate (IMDUR) 30 MG 24 hr tablet Take by mouth.     metFORMIN (GLUCOPHAGE) 1000 MG tablet Take 1,000 mg by mouth 2 (two) times daily.     Multiple Vitamins-Minerals (MULTIVITAMIN WITH MINERALS) tablet Take 1 tablet by mouth daily.     No current facility-administered medications for this encounter.    REVIEW OF SYSTEMS:  On review of systems, the patient reports that he is doing well  overall. He denies any chest pain, shortness of breath, cough, fevers, chills, night sweats, unintended weight changes. He denies any bowel disturbances, and denies abdominal pain, nausea or vomiting. He denies any new musculoskeletal or joint aches or pains. His IPSS was Total Score: 12, indicating moderate urinary symptoms. His SHIM: 5, indicating he has erectile dysfunction. A complete review of systems is obtained and is otherwise negative.   PHYSICAL EXAM:  Wt Readings from Last 3 Encounters:  12/14/20 196 lb 9.6 oz (89.2 kg)  07/23/17 170 lb (77.1 kg)   Temp Readings from Last 3 Encounters:  12/14/20 97.6 F (36.4 C)  11/18/20 98.3 F (36.8 C) (Oral)  07/23/17 98.6  F (37 C) (Oral)   BP Readings from Last 3 Encounters:  12/14/20 118/71  11/18/20 123/76  11/15/19 119/73   Pulse Readings from Last 3 Encounters:  12/14/20 89  11/18/20 76  11/15/19 60   Pain Assessment Pain Score: 0-No pain/10  In general this is a well appearing gentleman in no acute distress. He's alert and oriented x4 and appropriate throughout the examination. Cardiopulmonary assessment is negative for acute distress, and he exhibits normal effort.    KPS = 100  100 - Normal; no complaints; no evidence of disease. 90   - Able to carry on normal activity; minor signs or symptoms of disease. 80   - Normal activity with effort; some signs or symptoms of disease. 60   - Cares for self; unable to carry on normal activity or to do active work. 60   - Requires occasional assistance, but is able to care for most of his personal needs. 50   - Requires considerable assistance and frequent medical care. 14   - Disabled; requires special care and assistance. 10   - Severely disabled; hospital admission is indicated although death not imminent. 72   - Very sick; hospital admission necessary; active supportive treatment necessary. 10   - Moribund; fatal processes progressing rapidly. 0     - Dead  Karnofsky DA, Abelmann Olcott, Craver LS and [facility] [phone]) The use of the nitrogen mustards in the palliative treatment of carcinoma: with particular reference to bronchogenic carcinoma Cancer 1 634-56  LABORATORY DATA:  No results found for: WBC, HGB, HCT, MCV, PLT Lab Results  Component Value Date   NA 139 11/25/2020   K 4.4 11/25/2020   CL 104 11/25/2020   CO2 23 11/25/2020   No results found for: ALT, AST, GGT, ALKPHOS, BILITOT   RADIOGRAPHY: US Guided Needle Placement  Result Date: 11/18/2020 CLINICAL DATA:  Prostate biopsy. EXAM: IR ULTRASOUND GUIDED ASPIRATION/DRAINAGE TECHNIQUE: Ultrasound guidance was provided for prostate biopsy. COMPARISON:  None. FINDINGS: See above.  IMPRESSION: Ultrasound guidance provided for prostate biopsy. Electronically Signed   By: Aletta Edouard M.D.   On: 11/18/2020 16:35      IMPRESSION/PLAN: 1.  65 y.o. gentleman with Stage T1c adenocarcinoma of the prostate with Gleason score of 3+4, and PSA of 5.3. We discussed the patient's workup and outlined the nature of prostate cancer in this setting. The patient's T stage, Gleason's score, and PSA put him into the Favorable Intermediate risk group. Accordingly, he is eligible for a variety of potential treatment options including brachytherapy, 5.5-8 weeks of external radiation, or prostatectomy. We discussed the available radiation techniques, and focused on the details and logistics of delivery. The patient may not be an ideal candidate for brachytherapy boost with a prostate volume of 111 cc prior to downsizing from hormone  therapy. We discussed that based on his prostate volume, he would require beginning treatment with a 5 alpha reductase inhibitor and ADT for at least 3 months to allow for downsizing of the prostate prior to initiating radiotherapy. We discussed and outlined the risks, benefits, short and long-term effects associated with radiotherapy and compared and contrasted these with prostatectomy. We discussed the role of SpaceOAR gel in reducing the rectal toxicity associated with radiotherapy.   He appears to have a good understanding of his disease and our treatment recommendations which are of curative intent.  He was encouraged to ask questions that were answered to his stated satisfaction.  At the conclusion of our conversation, the patient is interested in moving forward with a consult with Dr. Tresa Moore on 12/28/20.  After that, he'll decide between IMRT and prostatectomy.  At this time, he seemed to be leaning toward IMRT, so, that he won't miss time at work.  But, he is open minded and understands that his enlarged prostate issues would be exacerbated with IMRT and resolved with  surgery..  We personally spent 60 minutes in this encounter including chart review, reviewing radiological studies, meeting face-to-face with the patient, entering orders and completing documentation.      Tyler Pita, MD  [facility]  Radiation Oncology Direct Dial: [phone]  Fax: [phone] [email or web address]  Skype  LinkedIn

## 2020-12-14 ENCOUNTER — Ambulatory Visit
Admission: RE | Admit: 2020-12-14 | Discharge: 2020-12-14 | Disposition: A | Discharge status: Home / Self Care | Payer: Managed Care, Other (non HMO) | Source: Ambulatory Visit | Attending: Radiation Oncology | Admitting: Radiation Oncology

## 2020-12-14 ENCOUNTER — Other Ambulatory Visit: Payer: Self-pay

## 2020-12-14 VITALS — BP 118/71 | HR 89 | Temp 97.6°F | Resp 20 | Ht 67.0 in | Wt 196.6 lb

## 2020-12-14 DIAGNOSIS — Z79899 Other long term (current) drug therapy: Secondary | ICD-10-CM | POA: Insufficient documentation

## 2020-12-14 DIAGNOSIS — Z7984 Long term (current) use of oral hypoglycemic drugs: Secondary | ICD-10-CM | POA: Diagnosis not present

## 2020-12-14 DIAGNOSIS — C61 Malignant neoplasm of prostate: Secondary | ICD-10-CM

## 2020-12-14 DIAGNOSIS — Z806 Family history of leukemia: Secondary | ICD-10-CM | POA: Insufficient documentation

## 2020-12-14 NOTE — Progress Notes (Signed)
Introduced myself to patient and his wife as the prostate nurse navigator and discussed my role.  No barriers to care identified at this time.  He is here to discuss his radiation treatment options. I gave him my business card and asked him to call me with questions or concerns.  Verbalized understanding.

## 2020-12-14 NOTE — Progress Notes (Signed)
GU Location of Tumor / Histology: Prostate Ca  If Prostate Cancer, Gleason Score is (4 + 3) and PSA is (5.3 as of 2/22)  Biopsies   Dr. Alyson Ingles Biopsy revealed Gleason 3+4=7 in 4/12 cores and Gleason 3+3=6 in 1/12 cores. PSA 5.3 He has mild LUTS on no BPH therapy. Prostate volume 110cc. He has moderate ED with PDE5s.   Past/Anticipated interventions by urology, if any:  See Dr. Tresa Moore for consideration of RALP and Dr. Tammi Klippel for consideration of IMRT. We will obtain a CT and bone scan prior to his appointments.  Return in about 4 weeks.  Weight changes, if any:   Weight at home is about 10 to 15 lbs less.  Weighing without clothing,  IPSS:  12 SHIM:  5  Bowel/Bladder complaints, if any:  Diarrhea x 1 yesterday.  No bladder issues at this time.  Nausea/Vomiting, if any:   No  Pain issues, if any:    0/10 scale  SAFETY ISSUES: Prior radiation?   No Pacemaker/ICD?   No Possible current pregnancy?  Male Is the patient on methotrexate?  No  Current Complaints / other details:  Need more information on treatment options.

## 2020-12-15 ENCOUNTER — Encounter: Payer: Self-pay | Admitting: *Deleted

## 2020-12-15 NOTE — Progress Notes (Signed)
[place] CSW Psychosocial Distress Screening   Social Work was referred by distress screening protocol.  The patient scored a 5 on the Psychosocial Distress Thermometer which indicates moderate distress. Social Work Theatre manager contacted patient by phone to assess for distress and other psychosocial needs.   SW Intern left a voicemail offering support and encouraging patient to return call.   ONCBCN DISTRESS SCREENING 12/14/2020  Screening Type Initial Screening  Distress experienced in past week (1-10) 5  Emotional problem type Adjusting to illness;Nervousness/Anxiety  Spiritual/Religous concerns type Facing my mortality  Information Concerns Type Lack of info about treatment  Physical Problem type Tingling hands/feet;Sexual problems    Rosary Lively, Social Work Intern Supervised by Gwinda Maine, LCSW

## 2020-12-21 NOTE — Telephone Encounter (Signed)
Were you able to get his MRI approved? If so, what is the auth number? Thanks

## 2020-12-23 ENCOUNTER — Encounter: Payer: Self-pay | Admitting: Urology

## 2020-12-23 ENCOUNTER — Ambulatory Visit (INDEPENDENT_AMBULATORY_CARE_PROVIDER_SITE_OTHER): Payer: Managed Care, Other (non HMO) | Admitting: Urology

## 2020-12-23 ENCOUNTER — Other Ambulatory Visit: Payer: Self-pay

## 2020-12-23 VITALS — BP 122/80 | HR 76

## 2020-12-23 DIAGNOSIS — C61 Malignant neoplasm of prostate: Secondary | ICD-10-CM

## 2020-12-23 LAB — URINALYSIS, ROUTINE W REFLEX MICROSCOPIC
Bilirubin, UA: NEGATIVE
Glucose, UA: NEGATIVE
Ketones, UA: NEGATIVE
Leukocytes,UA: NEGATIVE
Nitrite, UA: NEGATIVE
Protein,UA: NEGATIVE
RBC, UA: NEGATIVE
Specific Gravity, UA: 1.025 (ref 1.005–1.030)
Urobilinogen, Ur: 0.2 mg/dL (ref 0.2–1.0)
pH, UA: 5.5 (ref 5.0–7.5)

## 2020-12-23 NOTE — Progress Notes (Signed)
12/23/2020 10:26 AM   Scarlette Shorts 11-09-1955 [id number]  Referring provider: [facility], [place] [address]  Followup prostate cancer   HPI: Mr Alexander Brady is [phone] here for followup for prostate cancer. He met with Dr. Tammi Klippel and has elected to proceed with IMRT. He has mild LUTS. IPSS 8 QOL 1. He has mild ED   PMH: No past medical history on file.  Surgical History: Past Surgical History:  Procedure Laterality Date   COLONOSCOPY N/A 06/15/2017   Procedure: COLONOSCOPY;  Surgeon: Danie Binder, MD;  [facility];  Service: Endoscopy;  Laterality: N/A;  10:30   NONE      Home Medications:  Allergies as of 12/23/2020   No Known Allergies      Medication List        Accurate as of December 23, 2020 10:26 AM. If you have any questions, ask your nurse or doctor.          isosorbide mononitrate 30 MG 24 hr tablet Commonly known as: IMDUR Take by mouth.   metFORMIN 1000 MG tablet Commonly known as: GLUCOPHAGE Take 1,000 mg by mouth 2 (two) times daily.   multivitamin with minerals tablet Take 1 tablet by mouth daily.        Allergies: No Known Allergies  Family History: Family History  Problem Relation Age of Onset   Leukemia Mother    Prostate cancer Brother    Colon cancer Neg Hx    Colon polyps Neg Hx     Social History:  reports that he has never smoked. He has never used smokeless tobacco. He reports that he does not drink alcohol and does not use drugs.  ROS: All other review of systems were reviewed and are negative except what is noted above in HPI  Physical Exam: BP 122/80   Pulse 76   Constitutional:  Alert and oriented, No acute distress. HEENT: [place] AT, moist mucus membranes.  Trachea midline, no masses. Cardiovascular: No clubbing, cyanosis, or edema. Respiratory: Normal respiratory effort, no increased work of breathing. GI: Abdomen is soft, nontender, nondistended, no abdominal  masses GU: No CVA tenderness.  Lymph: No cervical or inguinal lymphadenopathy. Skin: No rashes, bruises or suspicious lesions. Neurologic: Grossly intact, no focal deficits, moving all 4 extremities. Psychiatric: Normal mood and affect.  Laboratory Data: No results found for: WBC, HGB, HCT, MCV, PLT  Lab Results  Component Value Date   CREATININE 0.75 (L) 11/25/2020    No results found for: PSA  No results found for: TESTOSTERONE  No results found for: HGBA1C  Urinalysis    Component Value Date/Time   APPEARANCEUR Clear 10/09/2020 1125   GLUCOSEU Negative 10/09/2020 1125   BILIRUBINUR Negative 10/09/2020 1125   PROTEINUR Negative 10/09/2020 1125   NITRITE Negative 10/09/2020 1125   LEUKOCYTESUR Negative 10/09/2020 1125    Lab Results  Component Value Date   LABMICR See below: 10/09/2020   WBCUA None seen 10/09/2020   LABEPIT None seen 10/09/2020   BACTERIA None seen 10/09/2020    Pertinent Imaging:  No results found for this or any previous visit.  No results found for this or any previous visit.  No results found for this or any previous visit.  No results found for this or any previous visit.  No results found for this or any previous visit.  No results found for this or any previous visit.  No results found for this or any previous visit.  No results found for this or any previous visit.   Assessment & Plan:    1. Prostate cancer ([place]) -MRI prostate -Schedule for fiducial markers and SPaceOAR - Urinalysis, Routine w reflex microscopic - BLADDER SCAN AMB NON-IMAGING   No follow-ups on file.  Nicolette Bang, MD  [facility] Urology [place]

## 2020-12-23 NOTE — Progress Notes (Signed)
post void residual=9 Urological Symptom Review  Patient is experiencing the following symptoms: Frequent urination Get up at night to urinate Erection problems (male only)   Review of Systems  Gastrointestinal (upper)  : Negative for upper GI symptoms  Gastrointestinal (lower) : Diarrhea  Constitutional : Night Sweats  Skin: Negative for skin symptoms  Eyes: Negative for eye symptoms  Ear/Nose/Throat : Negative for Ear/Nose/Throat symptoms  Hematologic/Lymphatic: Negative for Hematologic/Lymphatic symptoms  Cardiovascular : Chest pain  Respiratory : Negative for respiratory symptoms  Endocrine: Negative for endocrine symptoms  Musculoskeletal: Back pain Joint pain  Neurological: Headaches  Psychologic: Negative for psychiatric symptoms

## 2020-12-23 NOTE — Patient Instructions (Signed)
Prostate Cancer °The prostate is a small gland that helps make semen. It is located below a man's bladder, in front of the rectum. Prostate cancer is when abnormal cells grow in this gland. °What are the causes? °The cause of this condition is not known. °What increases the risk? °Being age 65 or older. °Having a family history of prostate cancer. °Having a family history of cancer of the breasts or ovaries. °Having genes that are passed from parent to child (inherited). °Having Lynch syndrome. °African American men and men of African descent are diagnosed with prostate cancer at higher rates than other men. °What are the signs or symptoms? °Problems peeing (urinating). This may include: °A stream that is weak, or pee that stops and starts. °Trouble starting or stopping your pee. °Trouble emptying all of your pee. °Needing to pee more often, especially at night. °Blood in your pee or semen. °Pain in the: °Lower back. °Lower belly (abdomen). °Hips. °Trouble getting an erection. °Weakness or numbness in the legs or feet. °How is this treated? °Treatment for this condition depends on: °How much the cancer has spread. °Your age. °The kind of treatment you want. °Your health. °Treatments include: °Being watched. This is called observation. You will be tested from time to time, but you will not get treated. Tests are to make sure that the cancer is not growing. °Surgery. This may be done to: °Take out (remove) the prostate. °Freeze and kill cancer cells. °Radiation. This uses a strong beam of energy to kill cancer cells. °Chemotherapy. This uses medicines that stop cancer cells from increasing. This kills cancer cells and healthy cells. °Targeted therapy. This kills cancer cells only. Healthy cells are not affected. °Hormone treatment. This stops the body from making hormones that help the cancer cells grow. °Follow these instructions at home: °Lifestyle °Do not smoke or use any products that contain nicotine or tobacco.  If you need help quitting, ask your doctor. °Eat a healthy diet. °Treatment may affect your ability to have sex. If you have a partner, touch, hold, hug, and caress your partner to have intimate moments. °Get plenty of sleep. °Ask your doctor for help to find a support group for men with prostate cancer. °General instructions °Take over-the-counter and prescription medicines only as told by your doctor. °If you have to go to the hospital, let your cancer doctor (oncologist) know. °Keep all follow-up visits. °Where to find more information °American Cancer Society: www.cancer.org °American Society of Clinical Oncology: www.cancer.net °National Cancer Institute: www.cancer.gov °Contact a doctor if: °You have new or more trouble peeing. °You have new or more blood in your pee. °You have new or more pain in your hips, back, or chest. °Get help right away if: °You have weakness in your legs. °You lose feeling in your legs. °You cannot control your pee or your poop (stool). °You have chills or a fever. °Summary °The prostate is a male gland that helps make semen. °Prostate cancer is when abnormal cells grow in this gland. °Treatment includes doing surgery, using medicines, using strong beams of energy, or watching without treatment. °Ask your doctor for help to find a support group for men with prostate cancer. °Contact a doctor if you have problems peeing or have any new pain that you did not have before. °This information is not intended to replace advice given to you by your health care provider. Make sure you discuss any questions you have with your health care provider. °Document Revised: 04/08/2020 Document Reviewed: 04/08/2020 °Elsevier   Patient Education © 2022 Elsevier Inc. ° °

## 2020-12-24 ENCOUNTER — Other Ambulatory Visit: Payer: Self-pay | Admitting: Urology

## 2020-12-24 ENCOUNTER — Other Ambulatory Visit: Payer: Self-pay

## 2020-12-24 DIAGNOSIS — C61 Malignant neoplasm of prostate: Secondary | ICD-10-CM

## 2020-12-24 DIAGNOSIS — R972 Elevated prostate specific antigen [PSA]: Secondary | ICD-10-CM

## 2020-12-24 NOTE — Progress Notes (Signed)
Opened in error

## 2020-12-24 NOTE — Patient Instructions (Signed)
Alexander Brady  12/24/2020     @PREFPERIOPPHARMACY @   Your procedure is scheduled on  12/28/2020.   Report to [facility] at  0800 A.M.   Call this number if you have problems the morning of surgery:  [phone]   Remember:  Do not eat or drink after midnight.      Take these medicines the morning of surgery with A SIP OF WATER                              isosorbide     Do not wear jewelry, make-up or nail polish.  Do not wear lotions, powders, or perfumes, or deodorant.  Do not shave 48 hours prior to surgery.  Men may shave face and neck.  Do not bring valuables to the hospital.  [facility] is not responsible for any belongings or valuables.  Contacts, dentures or bridgework may not be worn into surgery.  Leave your suitcase in the car.  After surgery it may be brought to your room.  For patients admitted to the hospital, discharge time will be determined by your treatment team.  Patients discharged the day of surgery will not be allowed to drive home and must have someone with them for 24 hours.    Special instructions:   DO NOT smoke tobacco or vape for 24 hours before your procedure.  Please read over the following fact sheets that you were given. Anesthesia Post-op Instructions and Care and Recovery After Surgery      Transrectal Ultrasound-Guided Prostate Gold Seed Placement, Care After The following information offers guidance on how to care for yourself after your procedure. Your health care provider may also give you more specific instructions. If you have problems or questions, contact your health care provider. What can I expect after the procedure? After the procedure, it is common to have: Light bleeding from the rectum. Bruising or tenderness in the area behind the scrotum (perineum), if the needle was put into your prostate through this area. Small amounts of blood in your urine. This should only last for a few days. Light brown or red  semen. This may last for a couple of weeks. Follow these instructions at home: Medicines  Take over-the-counter and prescription medicines only as told by your health care provider. If you were prescribed an antibiotic, take it as told by your health care provider. Do not stop taking the antibiotic early, even if you start to feel better. Ask your health care provider if the medicine prescribed to you requires you to avoid driving or using machinery. Eating and drinking Follow instructions from your health care provider about eating or drinking restrictions. Drink enough fluid to keep your urine pale yellow. Managing pain and swelling If directed, put ice on the affected area. To do this: Put ice in a plastic bag. Place a towel between your skin and the bag. Leave the ice on for 20 minutes, 2-3 times a day. Be sure to remove the ice if your skin turns bright red. If you cannot feel pain, heat, or cold, you have a greater risk of damage to the area. Try not to sit directly on the area behind the scrotum. A soft cushion can help with discomfort. Activity If you were given a sedative during the procedure, it can affect you for several hours. Do not drive or operate machinery until your health care  provider says that it is safe. Return to your normal activities as told by your health care provider. Ask your health care provider what activities are safe for you. Follow instructions from your health care provider about when it is safe for you to engage in sexual activity. General instructions Plan to have a responsible adult care for you for the time you are told after you leave the hospital or clinic. Do not take baths, swim, or use a hot tub until your health care provider approves. Ask your health care provider if you may take showers. You may only be allowed to take sponge baths. Keep all follow-up visits. Contact a health care provider if: You have a fever or chills. You have more blood in  your urine. You have blood in your urine for more than 2-3 days after the procedure. You have trouble passing urine or having a bowel movement. You have pain or burning when urinating. You have nausea or you vomit. Get help right away if: You have severe pain that does not get better with medicine. Your urine is bright red. You cannot urinate. You have rectal bleeding that gets worse. You have shortness of breath. Summary After the procedure, you may have blood in your urine and light bleeding from the rectum. Return to your normal activities as told by your health care provider. Ask your health care provider what activities are safe for you. Take over-the-counter and prescription medicines only as told by your health care provider. Contact your health care provider right away if your urine is bright red or you cannot pass urine. This information is not intended to replace advice given to you by your health care provider. Make sure you discuss any questions you have with your health care provider. Document Revised: 04/08/2020 Document Reviewed: 04/08/2020 Elsevier Patient Education  [place] Anesthesia, Adult, Care After This sheet gives you information about how to care for yourself after your procedure. Your health care provider may also give you more specific instructions. If you have problems or questions, contact your health care provider. What can I expect after the procedure? After the procedure, the following side effects are common: Pain or discomfort at the IV site. Nausea. Vomiting. Sore throat. Trouble concentrating. Feeling cold or chills. Feeling weak or tired. Sleepiness and fatigue. Soreness and body aches. These side effects can affect parts of the body that were not involved in surgery. Follow these instructions at home: For the time period you were told by your health care provider:  Rest. Do not participate in activities where you could fall  or become injured. Do not drive or use machinery. Do not drink alcohol. Do not take sleeping pills or medicines that cause drowsiness. Do not make important decisions or sign legal documents. Do not take care of children on your own. Eating and drinking Follow any instructions from your health care provider about eating or drinking restrictions. When you feel hungry, start by eating small amounts of foods that are soft and easy to digest (bland), such as toast. Gradually return to your regular diet. Drink enough fluid to keep your urine pale yellow. If you vomit, rehydrate by drinking water, juice, or clear broth. General instructions If you have sleep apnea, surgery and certain medicines can increase your risk for breathing problems. Follow instructions from your health care provider about wearing your sleep device: Anytime you are sleeping, including during daytime naps. While taking prescription pain medicines, sleeping medicines, or medicines that make  you drowsy. Have a responsible adult stay with you for the time you are told. It is important to have someone help care for you until you are awake and alert. Return to your normal activities as told by your health care provider. Ask your health care provider what activities are safe for you. Take over-the-counter and prescription medicines only as told by your health care provider. If you smoke, do not smoke without supervision. Keep all follow-up visits as told by your health care provider. This is important. Contact a health care provider if: You have nausea or vomiting that does not get better with medicine. You cannot eat or drink without vomiting. You have pain that does not get better with medicine. You are unable to pass urine. You develop a skin rash. You have a fever. You have redness around your IV site that gets worse. Get help right away if: You have difficulty breathing. You have chest pain. You have blood in your urine  or stool, or you vomit blood. Summary After the procedure, it is common to have a sore throat or nausea. It is also common to feel tired. Have a responsible adult stay with you for the time you are told. It is important to have someone help care for you until you are awake and alert. When you feel hungry, start by eating small amounts of foods that are soft and easy to digest (bland), such as toast. Gradually return to your regular diet. Drink enough fluid to keep your urine pale yellow. Return to your normal activities as told by your health care provider. Ask your health care provider what activities are safe for you. This information is not intended to replace advice given to you by your health care provider. Make sure you discuss any questions you have with your health care provider. Document Revised: 09/26/2019 Document Reviewed: 04/25/2019 Elsevier Patient Education  2022 [employer]. How to Use Chlorhexidine for Bathing Chlorhexidine gluconate (CHG) is a germ-killing (antiseptic) solution that is used to clean the skin. It can get rid of the bacteria that normally live on the skin and can keep them away for about 24 hours. To clean your skin with CHG, you may be given: A CHG solution to use in the shower or as part of a sponge bath. A prepackaged cloth that contains CHG. Cleaning your skin with CHG may help lower the risk for infection: While you are staying in the intensive care unit of the hospital. If you have a vascular access, such as a central line, to provide short-term or long-term access to your veins. If you have a catheter to drain urine from your bladder. If you are on a ventilator. A ventilator is a machine that helps you breathe by moving air in and out of your lungs. After surgery. What are the risks? Risks of using CHG include: A skin reaction. Hearing loss, if CHG gets in your ears and you have a perforated eardrum. Eye injury, if CHG gets in your eyes and is not  rinsed out. The CHG product catching fire. Make sure that you avoid smoking and flames after applying CHG to your skin. Do not use CHG: If you have a chlorhexidine allergy or have previously reacted to chlorhexidine. On babies younger than 61 months of age. How to use CHG solution Use CHG only as told by your health care provider, and follow the instructions on the label. Use the full amount of CHG as directed. Usually, this is one bottle.  During a shower Follow these steps when using CHG solution during a shower (unless your health care provider gives you different instructions): Start the shower. Use your normal soap and shampoo to wash your face and hair. Turn off the shower or move out of the shower stream. Pour the CHG onto a clean washcloth. Do not use any type of brush or rough-edged sponge. Starting at your neck, lather your body down to your toes. Make sure you follow these instructions: If you will be having surgery, pay special attention to the part of your body where you will be having surgery. Scrub this area for at least 1 minute. Do not use CHG on your head or face. If the solution gets into your ears or eyes, rinse them well with water. Avoid your genital area. Avoid any areas of skin that have broken skin, cuts, or scrapes. Scrub your back and under your arms. Make sure to wash skin folds. Let the lather sit on your skin for 1-2 minutes or as long as told by your health care provider. Thoroughly rinse your entire body in the shower. Make sure that all body creases and crevices are rinsed well. Dry off with a clean towel. Do not put any substances on your body afterward--such as powder, lotion, or perfume--unless you are told to do so by your health care provider. Only use lotions that are recommended by the manufacturer. Put on clean clothes or pajamas. If it is the night before your surgery, sleep in clean sheets.  During a sponge bath Follow these steps when using CHG  solution during a sponge bath (unless your health care provider gives you different instructions): Use your normal soap and shampoo to wash your face and hair. Pour the CHG onto a clean washcloth. Starting at your neck, lather your body down to your toes. Make sure you follow these instructions: If you will be having surgery, pay special attention to the part of your body where you will be having surgery. Scrub this area for at least 1 minute. Do not use CHG on your head or face. If the solution gets into your ears or eyes, rinse them well with water. Avoid your genital area. Avoid any areas of skin that have broken skin, cuts, or scrapes. Scrub your back and under your arms. Make sure to wash skin folds. Let the lather sit on your skin for 1-2 minutes or as long as told by your health care provider. Using a different clean, wet washcloth, thoroughly rinse your entire body. Make sure that all body creases and crevices are rinsed well. Dry off with a clean towel. Do not put any substances on your body afterward--such as powder, lotion, or perfume--unless you are told to do so by your health care provider. Only use lotions that are recommended by the manufacturer. Put on clean clothes or pajamas. If it is the night before your surgery, sleep in clean sheets. How to use CHG prepackaged cloths Only use CHG cloths as told by your health care provider, and follow the instructions on the label. Use the CHG cloth on clean, dry skin. Do not use the CHG cloth on your head or face unless your health care provider tells you to. When washing with the CHG cloth: Avoid your genital area. Avoid any areas of skin that have broken skin, cuts, or scrapes. Before surgery Follow these steps when using a CHG cloth to clean before surgery (unless your health care provider gives you different instructions): Using  the CHG cloth, vigorously scrub the part of your body where you will be having surgery. Scrub using a  back-and-forth motion for 3 minutes. The area on your body should be completely wet with CHG when you are done scrubbing. Do not rinse. Discard the cloth and let the area air-dry. Do not put any substances on the area afterward, such as powder, lotion, or perfume. Put on clean clothes or pajamas. If it is the night before your surgery, sleep in clean sheets.  For general bathing Follow these steps when using CHG cloths for general bathing (unless your health care provider gives you different instructions). Use a separate CHG cloth for each area of your body. Make sure you wash between any folds of skin and between your fingers and toes. Wash your body in the following order, switching to a new cloth after each step: The front of your neck, shoulders, and chest. Both of your arms, under your arms, and your hands. Your stomach and groin area, avoiding the genitals. Your right leg and foot. Your left leg and foot. The back of your neck, your back, and your buttocks. Do not rinse. Discard the cloth and let the area air-dry. Do not put any substances on your body afterward--such as powder, lotion, or perfume--unless you are told to do so by your health care provider. Only use lotions that are recommended by the manufacturer. Put on clean clothes or pajamas. Contact a health care provider if: Your skin gets irritated after scrubbing. You have questions about using your solution or cloth. You swallow any chlorhexidine. Call your local poison control center (1-(785)517-9508 in the [place] Get help right away if: Your eyes itch badly, or they become very red or swollen. Your skin itches badly and is red or swollen. Your hearing changes. You have trouble seeing. You have swelling or tingling in your mouth or throat. You have trouble breathing. These symptoms may represent a serious problem that is an emergency. Do not wait to see if the symptoms will go away. Get medical help right away. Call your  local emergency services (911 in the [place] Do not drive yourself to the hospital. Summary Chlorhexidine gluconate (CHG) is a germ-killing (antiseptic) solution that is used to clean the skin. Cleaning your skin with CHG may help to lower your risk for infection. You may be given CHG to use for bathing. It may be in a bottle or in a prepackaged cloth to use on your skin. Carefully follow your health care provider's instructions and the instructions on the product label. Do not use CHG if you have a chlorhexidine allergy. Contact your health care provider if your skin gets irritated after scrubbing. This information is not intended to replace advice given to you by your health care provider. Make sure you discuss any questions you have with your health care provider. Document Revised: 03/23/2020 Document Reviewed: 03/23/2020 Elsevier Patient Education  2022 [employer].

## 2020-12-24 NOTE — Progress Notes (Unsigned)
Surgical Physician Order Form [facility]  * Scheduling expectation : ASAP, December 5th  *Length of Case: 30 minutes  *MD Preforming Case: Nicolette Bang, MD  *Assistant Needed: no  *Facility Preference: [facility]  *Clearance needed: no  *Anticoagulation Instructions: May continue all anticoagulants  *Aspirin Instructions: Ok to continue Aspirin  -Admit type: OUTpatient  -Anesthesia: General  -Use Standing Orders:  NA  *Diagnosis: Prostate Cancer  *Procedure:  Fiducial markers with SpaceOAR, prostate [place]    Additional orders:  Fleets enema in AM  -Equipment:  BK [place] from [facility] -VTE Prophylaxis Standing Order SCD's       Other:   -Standing Lab Orders Per Anesthesia    Lab other:  labs per anesthesia  -Standing Test orders EKG/Chest x-ray per Anesthesia       Test other:   - Medications:  Ancef 2gm IV  -Other orders:  Fleets enema AM  *Post-op visit Date/Instructions:   2 weeks

## 2020-12-25 ENCOUNTER — Telehealth: Payer: Self-pay

## 2020-12-25 ENCOUNTER — Encounter (HOSPITAL_COMMUNITY): Payer: Self-pay | Admitting: Urgent Care

## 2020-12-25 ENCOUNTER — Encounter (HOSPITAL_COMMUNITY)
Admission: RE | Admit: 2020-12-25 | Discharge: 2020-12-25 | Disposition: A | Discharge status: Home / Self Care | Payer: Managed Care, Other (non HMO) | Source: Ambulatory Visit | Attending: Urology | Admitting: Urology

## 2020-12-25 ENCOUNTER — Encounter (HOSPITAL_COMMUNITY): Payer: Self-pay

## 2020-12-25 NOTE — Telephone Encounter (Signed)
I called and spoke with patient this am. Patient is able to move forward with surgery Monday, December 5th. However, patient did not show up for his pre-op appt. Patient states he was unaware of his pre-op appointment.  Messages sent to surgery schedulers to have surgery rescheduled for patient.  Patient can be reached at [phone]

## 2020-12-25 NOTE — Telephone Encounter (Signed)
-----   Message from Encarnacion Chu, RN sent at 12/25/2020  9:02 AM EST ----- Regarding: no show Good morning Estill Bamberg. Scarlette Shorts did not show for his preop this morning and we have not been able to reach him.

## 2020-12-28 ENCOUNTER — Ambulatory Visit (HOSPITAL_COMMUNITY): Admission: RE | Admit: 2020-12-28 | Payer: Managed Care, Other (non HMO) | Source: Home / Self Care | Admitting: Urology

## 2020-12-28 ENCOUNTER — Encounter (HOSPITAL_COMMUNITY): Admission: RE | Payer: Self-pay | Source: Home / Self Care

## 2020-12-28 ENCOUNTER — Ambulatory Visit (HOSPITAL_COMMUNITY): Admission: RE | Admit: 2020-12-28 | Payer: Managed Care, Other (non HMO) | Source: Ambulatory Visit

## 2020-12-28 ENCOUNTER — Telehealth: Payer: Self-pay

## 2020-12-28 SURGERY — INSERTION, GOLD SEEDS
Anesthesia: General

## 2020-12-28 NOTE — Telephone Encounter (Signed)
Pt was informed by Amanda,RN on the 12/25/20 regarding this, pt was made aware. Please see encounter from 12/25/2020.

## 2020-12-28 NOTE — Telephone Encounter (Signed)
Pt came by office wanting to know why his appointment was cancelled with McKenzie at [facility] this morning.  He says he didn't speak with anyone regarding canceling his surgery.  He says he did not know of a pre op appt at [facility].      Please advise.  Call back:  [phone] (H)   Thanks, Helene Kelp

## 2020-12-29 ENCOUNTER — Encounter: Payer: Self-pay | Admitting: Urology

## 2021-01-06 NOTE — Telephone Encounter (Signed)
Patient is having Prostate removed by [facility], no need to schedule with [facility]

## 2021-01-08 ENCOUNTER — Ambulatory Visit (HOSPITAL_COMMUNITY)
Admission: RE | Admit: 2021-01-08 | Discharge: 2021-01-08 | Disposition: A | Discharge status: Home / Self Care | Payer: Managed Care, Other (non HMO) | Source: Ambulatory Visit | Attending: Urology | Admitting: Urology

## 2021-01-08 ENCOUNTER — Other Ambulatory Visit: Payer: Self-pay

## 2021-01-08 DIAGNOSIS — C61 Malignant neoplasm of prostate: Secondary | ICD-10-CM | POA: Insufficient documentation

## 2021-01-08 MED ORDER — GADOBUTROL 1 MMOL/ML IV SOLN
9.0000 mL | Freq: Once | INTRAVENOUS | Status: AC | PRN
  Administered 2021-01-08: 9 mL via INTRAVENOUS

## 2021-01-12 ENCOUNTER — Other Ambulatory Visit: Payer: Self-pay | Admitting: Urology

## 2021-02-02 ENCOUNTER — Other Ambulatory Visit: Payer: Self-pay

## 2021-02-02 DIAGNOSIS — C61 Malignant neoplasm of prostate: Secondary | ICD-10-CM

## 2021-02-02 NOTE — Progress Notes (Signed)
After meeting with Dr. Tresa Moore 12/28/20, patient elects to proceed with [facility] and is scheduled for 02/24/21.  Nicholos Johns, MMS, PA-C [facility] [place]: [phone]   Fax: [phone]

## 2021-02-04 NOTE — Progress Notes (Addendum)
Anesthesia Review:  PCP: DR Ileana Roup in [place]  Cardiologist : DR Assar in [place] - has appt week of 02/07/21- pt unsure of day for physical  Pt reports having some chest pain in past  Appt with DR Assar on 02/11/2021 at 1020am  Chest x-ray : EKG :02/05/21  Echo : 9.3.2021- Care Everywhere  Stress test:09/27/19- Care Everywhere  Cardiac Cath :  Activity level: can do a flight of stairs without difficulty  Sleep Study/ CPAP : none  Fasting Blood Sugar :      / Checks Blood Sugar -- times a day:   Blood Thinner/ Instructions /Last Dose: ASA / Instructions/ Last Dose :   Dm- borderline per pt does not check glucose at home  Hgba1c- 02/05/21 - 5.8  Covid test on 02/22/2021 at 0930am

## 2021-02-04 NOTE — Progress Notes (Signed)
Covid test on 02/22/2021 at 0930am.  Come thru Main Entrance at [facility].  Have a seat in the lobby to the right.  Call [phone] and give them your name and let them know you are here for covid testing.                 Alexander Brady  02/04/2021   Your procedure is scheduled on:  02/24/2021.    Report to [facility] Main  Entrance   Report to admitting at   0615am.       Call this number if you have problems the morning of surgery [phone]    Remember: Do not eat food , candy gum or mints :After Midnight. You may have clear liquids from midnight until __ 0530am.   Clear liquid diet the day before surgery.  At 4pm mix 255 g of Miralax with 64 ounces of Gatorade.  Drink one glass every 15-30 minutes until finished.     CLEAR LIQUID DIET   Foods Allowed                                                                       Coffee and tea, regular and decaf                              Plain Jell-O any favor except red or purple                                            Fruit ices (not with fruit pulp)                                      Iced Popsicles                                     Carbonated beverages, regular and diet                                    Cranberry, grape and apple juices Sports drinks like Gatorade Lightly seasoned clear broth or consume(fat free) Sugar   _____________________________________________________________________    BRUSH YOUR TEETH MORNING OF SURGERY AND RINSE YOUR MOUTH OUT, NO CHEWING GUM CANDY OR MINTS.     Take these medicines the morning of surgery with A SIP OF WATER:  none   DO NOT TAKE ANY DIABETIC MEDICATIONS DAY OF YOUR SURGERY                               You may not have any metal on your body including hair pins and              piercings  Do not wear jewelry, make-up, lotions, powders or perfumes, deodorant  Do not wear nail polish on your fingernails.  Do not shave  48 hours prior to surgery.               Men may shave face and neck.   Do not bring valuables to the hospital. [place].  Contacts, dentures or bridgework may not be worn into surgery.  Leave suitcase in the car. After surgery it may be brought to your room.     Patients discharged the day of surgery will not be allowed to drive home. IF YOU ARE HAVING SURGERY AND GOING HOME THE SAME DAY, YOU MUST HAVE AN ADULT TO DRIVE YOU HOME AND BE WITH YOU FOR 24 HOURS. YOU MAY GO HOME BY TAXI OR UBER OR ORTHERWISE, BUT AN ADULT MUST ACCOMPANY YOU HOME AND STAY WITH YOU FOR 24 HOURS.  Name and phone number of your driver:  Special Instructions: N/A              Please read over the following fact sheets you were given: _____________________________________________________________________  [facility] - Preparing for Surgery Before surgery, you can play an important role.  Because skin is not sterile, your skin needs to be as free of germs as possible.  You can reduce the number of germs on your skin by washing with CHG (chlorahexidine gluconate) soap before surgery.  CHG is an antiseptic cleaner which kills germs and bonds with the skin to continue killing germs even after washing. Please DO NOT use if you have an allergy to CHG or antibacterial soaps.  If your skin becomes reddened/irritated stop using the CHG and inform your nurse when you arrive at Short Stay. Do not shave (including legs and underarms) for at least 48 hours prior to the first CHG shower.  You may shave your face/neck. Please follow these instructions carefully:  1.  Shower with CHG Soap the night before surgery and the  morning of Surgery.  2.  If you choose to wash your hair, wash your hair first as usual with your  normal  shampoo.  3.  After you shampoo, rinse your hair and body thoroughly to remove the  shampoo.                           4.  Use CHG as you would any other liquid soap.  You can apply chg directly   to the skin and wash                       Gently with a scrungie or clean washcloth.  5.  Apply the CHG Soap to your body ONLY FROM THE NECK DOWN.   Do not use on face/ open                           Wound or open sores. Avoid contact with eyes, ears mouth and genitals (private parts).                       Wash face,  Genitals (private parts) with your normal soap.             6.  Wash thoroughly, paying special attention to the area where your surgery  will be performed.  7.  Thoroughly rinse your body  with warm water from the neck down.  8.  DO NOT shower/wash with your normal soap after using and rinsing off  the CHG Soap.                9.  Pat yourself dry with a clean towel.            10.  Wear clean pajamas.            11.  Place clean sheets on your bed the night of your first shower and do not  sleep with pets. Day of Surgery : Do not apply any lotions/deodorants the morning of surgery.  Please wear clean clothes to the hospital/surgery center.  FAILURE TO FOLLOW THESE INSTRUCTIONS MAY RESULT IN THE CANCELLATION OF YOUR SURGERY PATIENT SIGNATURE_________________________________  NURSE SIGNATURE__________________________________  ________________________________________________________________________

## 2021-02-05 ENCOUNTER — Encounter (HOSPITAL_COMMUNITY)
Admission: RE | Admit: 2021-02-05 | Discharge: 2021-02-05 | Disposition: A | Discharge status: Home / Self Care | Payer: Managed Care, Other (non HMO) | Source: Ambulatory Visit | Attending: Urology | Admitting: Urology

## 2021-02-05 ENCOUNTER — Other Ambulatory Visit: Payer: Self-pay

## 2021-02-05 ENCOUNTER — Encounter (HOSPITAL_COMMUNITY): Payer: Self-pay

## 2021-02-05 VITALS — BP 131/76 | HR 83 | Temp 98.1°F | Resp 18 | Ht 67.0 in | Wt 200.3 lb

## 2021-02-05 DIAGNOSIS — Z01818 Encounter for other preprocedural examination: Secondary | ICD-10-CM | POA: Insufficient documentation

## 2021-02-05 DIAGNOSIS — C61 Malignant neoplasm of prostate: Secondary | ICD-10-CM

## 2021-02-05 HISTORY — DX: Malignant (primary) neoplasm, unspecified: C80.1

## 2021-02-05 HISTORY — DX: Type 2 diabetes mellitus without complications: E11.9

## 2021-02-05 HISTORY — DX: Headache, unspecified: R51.9

## 2021-02-05 LAB — CBC
HCT: 45.1 % (ref 39.0–52.0)
Hemoglobin: 14.9 g/dL (ref 13.0–17.0)
MCH: 32.3 pg (ref 26.0–34.0)
MCHC: 33 g/dL (ref 30.0–36.0)
MCV: 97.6 fL (ref 80.0–100.0)
Platelets: 281 10*3/uL (ref 150–400)
RBC: 4.62 MIL/uL (ref 4.22–5.81)
RDW: 13.7 % (ref 11.5–15.5)
WBC: 9.6 10*3/uL (ref 4.0–10.5)
nRBC: 0 % (ref 0.0–0.2)

## 2021-02-05 LAB — HEMOGLOBIN A1C
Hgb A1c MFr Bld: 5.8 % — ABNORMAL HIGH (ref 4.8–5.6)
Mean Plasma Glucose: 119.76 mg/dL

## 2021-02-05 LAB — GLUCOSE, CAPILLARY: Glucose-Capillary: 105 mg/dL — ABNORMAL HIGH (ref 70–99)

## 2021-02-05 LAB — BASIC METABOLIC PANEL
Anion gap: 2 — ABNORMAL LOW (ref 5–15)
BUN: 21 mg/dL (ref 8–23)
CO2: 24 mmol/L (ref 22–32)
Calcium: 8.9 mg/dL (ref 8.9–10.3)
Chloride: 109 mmol/L (ref 98–111)
Creatinine, Ser: 0.68 mg/dL (ref 0.61–1.24)
GFR, Estimated: >60 mL/min (ref >60)
Glucose, Bld: 101 mg/dL — ABNORMAL HIGH (ref 70–99)
Potassium: 4.2 mmol/L (ref 3.5–5.1)
Sodium: 135 mmol/L (ref 135–145)

## 2021-02-22 ENCOUNTER — Encounter (HOSPITAL_COMMUNITY)
Admission: RE | Admit: 2021-02-22 | Discharge: 2021-02-22 | Disposition: A | Discharge status: Home / Self Care | Payer: Managed Care, Other (non HMO) | Source: Ambulatory Visit | Attending: Urology | Admitting: Urology

## 2021-02-22 ENCOUNTER — Other Ambulatory Visit: Payer: Self-pay

## 2021-02-22 DIAGNOSIS — Z20822 Contact with and (suspected) exposure to covid-19: Secondary | ICD-10-CM | POA: Diagnosis not present

## 2021-02-22 DIAGNOSIS — Z01812 Encounter for preprocedural laboratory examination: Secondary | ICD-10-CM | POA: Insufficient documentation

## 2021-02-22 LAB — SARS CORONAVIRUS 2 (TAT 6-24 HRS): SARS Coronavirus 2: NEGATIVE

## 2021-02-23 NOTE — Anesthesia Preprocedure Evaluation (Addendum)
Anesthesia Evaluation  Patient identified by MRN, date of birth, ID band Patient awake    Reviewed: Allergy & Precautions, NPO status , Patient's Chart, lab work & pertinent test results  Airway Mallampati: II  TM Distance: >3 FB Neck ROM: Full    Dental  (+) Poor Dentition, Missing   Pulmonary neg pulmonary ROS,    Pulmonary exam normal        Cardiovascular hypertension,  Rhythm:Regular Rate:Normal     Neuro/Psych  Headaches,    GI/Hepatic negative GI ROS, Neg liver ROS,   Endo/Other  diabetes, Type 2, Oral Hypoglycemic Agents  Renal/GU negative Renal ROS   Prostate Ca    Musculoskeletal negative musculoskeletal ROS (+)   Abdominal Normal abdominal exam  (+)   Peds  Hematology negative hematology ROS (+)   Anesthesia Other Findings   Reproductive/Obstetrics                            Anesthesia Physical Anesthesia Plan  ASA: 2  Anesthesia Plan: General   Post-op Pain Management: Tylenol PO (pre-op) and Celebrex PO (pre-op)   Induction: Intravenous  PONV Risk Score and Plan: 2 and Ondansetron, Dexamethasone, Midazolam and Treatment may vary due to age or medical condition  Airway Management Planned: Mask and Oral ETT  Additional Equipment: None  Intra-op Plan:   Post-operative Plan: Extubation in OR  Informed Consent: I have reviewed the patients History and Physical, chart, labs and discussed the procedure including the risks, benefits and alternatives for the proposed anesthesia with the patient or authorized representative who has indicated his/her understanding and acceptance.     Dental advisory given  Plan Discussed with: CRNA  Anesthesia Plan Comments: (Lab Results      Component                Value               Date                      WBC                      9.6                 02/05/2021                HGB                      14.9                 02/05/2021                HCT                      45.1                02/05/2021                MCV                      97.6                02/05/2021                PLT                      281  02/05/2021           Lab Results      Component                Value               Date                      NA                       135                 02/05/2021                K                        4.2                 02/05/2021                CO2                      24                  02/05/2021                GLUCOSE                  101 (H)             02/05/2021                BUN                      21                  02/05/2021                CREATININE               0.68                02/05/2021                CALCIUM                  8.9                 02/05/2021                EGFR                     101                 11/25/2020                GFRNONAA                 >60                 02/05/2021          )       Anesthesia Quick Evaluation

## 2021-02-24 ENCOUNTER — Encounter (HOSPITAL_COMMUNITY): Payer: Self-pay | Admitting: Urology

## 2021-02-24 ENCOUNTER — Ambulatory Visit (HOSPITAL_COMMUNITY): Payer: Managed Care, Other (non HMO) | Admitting: Physician Assistant

## 2021-02-24 ENCOUNTER — Ambulatory Visit (HOSPITAL_COMMUNITY): Payer: Managed Care, Other (non HMO) | Admitting: Anesthesiology

## 2021-02-24 ENCOUNTER — Other Ambulatory Visit: Payer: Self-pay

## 2021-02-24 ENCOUNTER — Observation Stay (HOSPITAL_COMMUNITY)
Admission: RE | Admit: 2021-02-24 | Discharge: 2021-02-26 | Disposition: A | Discharge status: Home / Self Care | Payer: Managed Care, Other (non HMO) | Source: Ambulatory Visit | Attending: Urology | Admitting: Urology

## 2021-02-24 ENCOUNTER — Encounter (HOSPITAL_COMMUNITY)
Admission: RE | Disposition: A | Discharge status: Home / Self Care | Payer: Self-pay | Source: Ambulatory Visit | Attending: Urology

## 2021-02-24 DIAGNOSIS — E119 Type 2 diabetes mellitus without complications: Secondary | ICD-10-CM | POA: Diagnosis not present

## 2021-02-24 DIAGNOSIS — C61 Malignant neoplasm of prostate: Secondary | ICD-10-CM | POA: Diagnosis not present

## 2021-02-24 DIAGNOSIS — Z01812 Encounter for preprocedural laboratory examination: Secondary | ICD-10-CM

## 2021-02-24 HISTORY — PX: LYMPH NODE DISSECTION: SHX5087

## 2021-02-24 HISTORY — PX: ROBOT ASSISTED LAPAROSCOPIC RADICAL PROSTATECTOMY: SHX5141

## 2021-02-24 LAB — BASIC METABOLIC PANEL
Anion gap: 6 (ref 5–15)
BUN: 12 mg/dL (ref 8–23)
CO2: 25 mmol/L (ref 22–32)
Calcium: 7.9 mg/dL — ABNORMAL LOW (ref 8.9–10.3)
Chloride: 105 mmol/L (ref 98–111)
Creatinine, Ser: 0.96 mg/dL (ref 0.61–1.24)
GFR, Estimated: >60 mL/min (ref >60)
Glucose, Bld: 150 mg/dL — ABNORMAL HIGH (ref 70–99)
Potassium: 4 mmol/L (ref 3.5–5.1)
Sodium: 136 mmol/L (ref 135–145)

## 2021-02-24 LAB — GLUCOSE, CAPILLARY
Glucose-Capillary: 104 mg/dL — ABNORMAL HIGH (ref 70–99)
Glucose-Capillary: 119 mg/dL — ABNORMAL HIGH (ref 70–99)
Glucose-Capillary: 119 mg/dL — ABNORMAL HIGH (ref 70–99)
Glucose-Capillary: 120 mg/dL — ABNORMAL HIGH (ref 70–99)

## 2021-02-24 LAB — CBC
HCT: 41.3 % (ref 39.0–52.0)
Hemoglobin: 13.5 g/dL (ref 13.0–17.0)
MCH: 32.2 pg (ref 26.0–34.0)
MCHC: 32.7 g/dL (ref 30.0–36.0)
MCV: 98.6 fL (ref 80.0–100.0)
Platelets: 273 10*3/uL (ref 150–400)
RBC: 4.19 MIL/uL — ABNORMAL LOW (ref 4.22–5.81)
RDW: 13.6 % (ref 11.5–15.5)
WBC: 17.1 10*3/uL — ABNORMAL HIGH (ref 4.0–10.5)
nRBC: 0 % (ref 0.0–0.2)

## 2021-02-24 LAB — ABO/RH: ABO/RH(D): A POS

## 2021-02-24 LAB — TYPE AND SCREEN
ABO/RH(D): A POS
Antibody Screen: NEGATIVE

## 2021-02-24 SURGERY — PROSTATECTOMY, RADICAL, ROBOT-ASSISTED, LAPAROSCOPIC
Anesthesia: General | Site: Pelvis

## 2021-02-24 MED ORDER — LIDOCAINE 2% (20 MG/ML) 5 ML SYRINGE
INTRAMUSCULAR | Status: DC | PRN
Start: 2021-02-24 — End: 2021-02-24
  Administered 2021-02-24: 80 mg via INTRAVENOUS

## 2021-02-24 MED ORDER — FENTANYL CITRATE PF 50 MCG/ML IJ SOSY
PREFILLED_SYRINGE | INTRAMUSCULAR | Status: AC
  Administered 2021-02-24: 50 ug via INTRAVENOUS
  Filled 2021-02-24: qty 3

## 2021-02-24 MED ORDER — ACETAMINOPHEN 10 MG/ML IV SOLN
1000.0000 mg | Freq: Four times a day (QID) | INTRAVENOUS | Status: AC
  Administered 2021-02-24 – 2021-02-25 (×4): 1000 mg via INTRAVENOUS
  Filled 2021-02-24 (×4): qty 100

## 2021-02-24 MED ORDER — PROPOFOL 10 MG/ML IV BOLUS
INTRAVENOUS | Status: DC | PRN
  Administered 2021-02-24: 180 mg via INTRAVENOUS
  Administered 2021-02-24: 20 mg via INTRAVENOUS

## 2021-02-24 MED ORDER — ORAL CARE MOUTH RINSE
15.0000 mL | Freq: Once | OROMUCOSAL | Status: AC
  Administered 2021-02-24: 15 mL via OROMUCOSAL

## 2021-02-24 MED ORDER — BACITRACIN-NEOMYCIN-POLYMYXIN 400-5-5000 EX OINT: 1.0000 "application " | TOPICAL_OINTMENT | Freq: Three times a day (TID) | CUTANEOUS | Status: DC | PRN

## 2021-02-24 MED ORDER — SODIUM CHLORIDE 0.9 % IV BOLUS
1000.0000 mL | Freq: Once | INTRAVENOUS | Status: AC
  Administered 2021-02-24: 1000 mL via INTRAVENOUS

## 2021-02-24 MED ORDER — ONDANSETRON HCL 4 MG/2ML IJ SOLN
INTRAMUSCULAR | Status: DC | PRN
Start: 2021-02-24 — End: 2021-02-24
  Administered 2021-02-24: 4 mg via INTRAVENOUS

## 2021-02-24 MED ORDER — SODIUM CHLORIDE 0.9% FLUSH
3.0000 mL | Freq: Two times a day (BID) | INTRAVENOUS | Status: DC
  Administered 2021-02-24 – 2021-02-25 (×3): 3 mL via INTRAVENOUS

## 2021-02-24 MED ORDER — MIDAZOLAM HCL 5 MG/5ML IJ SOLN
INTRAMUSCULAR | Status: DC | PRN
  Administered 2021-02-24: 2 mg via INTRAVENOUS

## 2021-02-24 MED ORDER — KETOROLAC TROMETHAMINE 0.5 % OP SOLN
1.0000 [drp] | Freq: Four times a day (QID) | OPHTHALMIC | Status: DC
  Administered 2021-02-24 – 2021-02-25 (×6): 1 [drp] via OPHTHALMIC
  Filled 2021-02-24 (×2): qty 5

## 2021-02-24 MED ORDER — TRAMADOL HCL 50 MG PO TABS
50.0000 mg | ORAL_TABLET | ORAL | Status: DC | PRN
Start: 2021-02-24 — End: 2021-02-26

## 2021-02-24 MED ORDER — SODIUM CHLORIDE (PF) 0.9 % IJ SOLN
INTRAMUSCULAR | Status: DC | PRN
  Administered 2021-02-24: 20 mL

## 2021-02-24 MED ORDER — FENTANYL CITRATE PF 50 MCG/ML IJ SOSY
25.0000 ug | PREFILLED_SYRINGE | INTRAMUSCULAR | Status: DC | PRN
  Administered 2021-02-24: 50 ug via INTRAVENOUS

## 2021-02-24 MED ORDER — OXYCODONE HCL 5 MG PO TABS: 5.0000 mg | ORAL_TABLET | Freq: Once | ORAL | Status: DC | PRN

## 2021-02-24 MED ORDER — TRAMADOL HCL 50 MG PO TABS
100.0000 mg | ORAL_TABLET | ORAL | Status: DC | PRN
  Administered 2021-02-25 (×2): 100 mg via ORAL
  Filled 2021-02-24 (×2): qty 2

## 2021-02-24 MED ORDER — CHLORHEXIDINE GLUCONATE 0.12 % MT SOLN: 15.0000 mL | Freq: Once | OROMUCOSAL | Status: AC

## 2021-02-24 MED ORDER — ONDANSETRON HCL 4 MG/2ML IJ SOLN
INTRAMUSCULAR | Status: AC
  Filled 2021-02-24: qty 2

## 2021-02-24 MED ORDER — INSULIN ASPART 100 UNIT/ML IJ SOLN: 0.0000 [IU] | Freq: Three times a day (TID) | INTRAMUSCULAR | Status: DC

## 2021-02-24 MED ORDER — ONDANSETRON HCL 4 MG/2ML IJ SOLN
4.0000 mg | INTRAMUSCULAR | Status: DC | PRN
  Administered 2021-02-24 – 2021-02-25 (×2): 4 mg via INTRAVENOUS
  Filled 2021-02-24 (×2): qty 2

## 2021-02-24 MED ORDER — OXYCODONE HCL 5 MG/5ML PO SOLN: 5.0000 mg | Freq: Once | ORAL | Status: DC | PRN

## 2021-02-24 MED ORDER — KETOROLAC TROMETHAMINE 15 MG/ML IJ SOLN
15.0000 mg | Freq: Four times a day (QID) | INTRAMUSCULAR | Status: AC
  Administered 2021-02-24 – 2021-02-25 (×5): 15 mg via INTRAVENOUS
  Filled 2021-02-24 (×5): qty 1

## 2021-02-24 MED ORDER — PROPOFOL 10 MG/ML IV BOLUS
INTRAVENOUS | Status: AC
  Filled 2021-02-24: qty 20

## 2021-02-24 MED ORDER — HYDROMORPHONE HCL 1 MG/ML IJ SOLN
0.5000 mg | INTRAMUSCULAR | Status: DC | PRN
  Administered 2021-02-24 (×2): 1 mg via INTRAVENOUS
  Filled 2021-02-24 (×2): qty 1

## 2021-02-24 MED ORDER — SODIUM CHLORIDE 0.9% FLUSH: 3.0000 mL | INTRAVENOUS | Status: DC | PRN

## 2021-02-24 MED ORDER — ACETAMINOPHEN 500 MG PO TABS
1000.0000 mg | ORAL_TABLET | Freq: Once | ORAL | Status: AC
  Administered 2021-02-24: 1000 mg via ORAL
  Filled 2021-02-24: qty 2

## 2021-02-24 MED ORDER — HYDROCODONE-ACETAMINOPHEN 5-325 MG PO TABS: 1.0000 | ORAL_TABLET | Freq: Four times a day (QID) | ORAL | 0 refills | Status: AC | PRN

## 2021-02-24 MED ORDER — ROCURONIUM BROMIDE 10 MG/ML (PF) SYRINGE
PREFILLED_SYRINGE | INTRAVENOUS | Status: DC | PRN
Start: 2021-02-24 — End: 2021-02-24
  Administered 2021-02-24: 20 mg via INTRAVENOUS
  Administered 2021-02-24: 40 mg via INTRAVENOUS
  Administered 2021-02-24: 60 mg via INTRAVENOUS
  Administered 2021-02-24: 20 mg via INTRAVENOUS

## 2021-02-24 MED ORDER — PROMETHAZINE HCL 25 MG/ML IJ SOLN: 6.2500 mg | INTRAMUSCULAR | Status: DC | PRN

## 2021-02-24 MED ORDER — MIDAZOLAM HCL 2 MG/2ML IJ SOLN
INTRAMUSCULAR | Status: AC
  Filled 2021-02-24: qty 2

## 2021-02-24 MED ORDER — CEFAZOLIN SODIUM-DEXTROSE 1-4 GM/50ML-% IV SOLN
1.0000 g | Freq: Three times a day (TID) | INTRAVENOUS | Status: AC
  Administered 2021-02-24 (×2): 1 g via INTRAVENOUS
  Filled 2021-02-24 (×2): qty 50

## 2021-02-24 MED ORDER — DOCUSATE SODIUM 100 MG PO CAPS: 100.0000 mg | ORAL_CAPSULE | Freq: Two times a day (BID) | ORAL | Status: AC

## 2021-02-24 MED ORDER — KETOROLAC TROMETHAMINE 0.5 % OP SOLN: 1.0000 [drp] | Freq: Four times a day (QID) | OPHTHALMIC | Status: DC

## 2021-02-24 MED ORDER — DOCUSATE SODIUM 100 MG PO CAPS
100.0000 mg | ORAL_CAPSULE | Freq: Two times a day (BID) | ORAL | Status: DC
  Administered 2021-02-24 – 2021-02-25 (×3): 100 mg via ORAL
  Filled 2021-02-24 (×3): qty 1

## 2021-02-24 MED ORDER — POLYETHYLENE GLYCOL 3350 17 GM/SCOOP PO POWD
1.0000 | Freq: Once | ORAL | Status: DC
Start: 2021-02-24 — End: 2021-02-24

## 2021-02-24 MED ORDER — LACTATED RINGERS IR SOLN
Status: DC | PRN
  Administered 2021-02-24: 1000 mL

## 2021-02-24 MED ORDER — CEFAZOLIN SODIUM-DEXTROSE 2-4 GM/100ML-% IV SOLN
2.0000 g | INTRAVENOUS | Status: AC
  Administered 2021-02-24: 2 g via INTRAVENOUS
  Filled 2021-02-24: qty 100

## 2021-02-24 MED ORDER — SUGAMMADEX SODIUM 200 MG/2ML IV SOLN
INTRAVENOUS | Status: DC | PRN
  Administered 2021-02-24: 200 mg via INTRAVENOUS

## 2021-02-24 MED ORDER — SULFAMETHOXAZOLE-TRIMETHOPRIM 800-160 MG PO TABS
1.0000 | ORAL_TABLET | Freq: Two times a day (BID) | ORAL | 0 refills | Status: DC
Start: 2021-02-24 — End: 2021-05-07

## 2021-02-24 MED ORDER — DEXAMETHASONE SODIUM PHOSPHATE 10 MG/ML IJ SOLN
INTRAMUSCULAR | Status: DC | PRN
  Administered 2021-02-24: 5 mg via INTRAVENOUS

## 2021-02-24 MED ORDER — SODIUM CHLORIDE (PF) 0.9 % IJ SOLN
INTRAMUSCULAR | Status: AC
  Filled 2021-02-24: qty 20

## 2021-02-24 MED ORDER — BUPIVACAINE LIPOSOME 1.3 % IJ SUSP
INTRAMUSCULAR | Status: AC
  Filled 2021-02-24: qty 20

## 2021-02-24 MED ORDER — INSULIN ASPART 100 UNIT/ML IJ SOLN: 0.0000 [IU] | Freq: Every day | INTRAMUSCULAR | Status: DC

## 2021-02-24 MED ORDER — LACTATED RINGERS IV SOLN: INTRAVENOUS | Status: DC

## 2021-02-24 MED ORDER — FENTANYL CITRATE (PF) 100 MCG/2ML IJ SOLN
INTRAMUSCULAR | Status: DC | PRN
  Administered 2021-02-24 (×4): 50 ug via INTRAVENOUS
  Administered 2021-02-24: 100 ug via INTRAVENOUS
  Administered 2021-02-24 (×2): 50 ug via INTRAVENOUS

## 2021-02-24 MED ORDER — STERILE WATER FOR INJECTION IJ SOLN
INTRAMUSCULAR | Status: DC | PRN
  Administered 2021-02-24: 10 mL

## 2021-02-24 MED ORDER — CELECOXIB 200 MG PO CAPS
200.0000 mg | ORAL_CAPSULE | Freq: Once | ORAL | Status: AC
  Administered 2021-02-24: 200 mg via ORAL
  Filled 2021-02-24: qty 1

## 2021-02-24 MED ORDER — FENTANYL CITRATE (PF) 250 MCG/5ML IJ SOLN
INTRAMUSCULAR | Status: AC
  Filled 2021-02-24: qty 5

## 2021-02-24 MED ORDER — SODIUM CHLORIDE 0.9 % IV SOLN: 250.0000 mL | INTRAVENOUS | Status: DC | PRN

## 2021-02-24 MED ORDER — 0.9 % SODIUM CHLORIDE (POUR BTL) OPTIME
TOPICAL | Status: DC | PRN
  Administered 2021-02-24: 1000 mL

## 2021-02-24 MED ORDER — SODIUM CHLORIDE 0.9 % IV SOLN: INTRAVENOUS | Status: DC

## 2021-02-24 MED ORDER — BUPIVACAINE LIPOSOME 1.3 % IJ SUSP
INTRAMUSCULAR | Status: DC | PRN
  Administered 2021-02-24: 20 mL

## 2021-02-24 SURGICAL SUPPLY — 67 items
APPLICATOR COTTON TIP 6 STRL (MISCELLANEOUS) ×2 IMPLANT
APPLICATOR COTTON TIP 6IN STRL (MISCELLANEOUS) ×3
BAG COUNTER SPONGE SURGICOUNT (BAG) IMPLANT
CATH FOLEY 2WAY SLVR 18FR 30CC (CATHETERS) ×3 IMPLANT
CATH TIEMANN FOLEY 18FR 5CC (CATHETERS) ×3 IMPLANT
CHLORAPREP W/TINT 26 (MISCELLANEOUS) ×3 IMPLANT
CLIP LIGATING HEM O LOK PURPLE (MISCELLANEOUS) ×12 IMPLANT
CNTNR URN SCR LID CUP LEK RST (MISCELLANEOUS) ×2 IMPLANT
CONT SPEC 4OZ STRL OR WHT (MISCELLANEOUS) ×1
COVER SURGICAL LIGHT HANDLE (MISCELLANEOUS) ×3 IMPLANT
COVER TIP SHEARS 8 DVNC (MISCELLANEOUS) ×2 IMPLANT
COVER TIP SHEARS 8MM DA VINCI (MISCELLANEOUS) ×1
CUTTER ECHEON FLEX ENDO 45 340 (ENDOMECHANICALS) ×3 IMPLANT
DERMABOND ADVANCED (GAUZE/BANDAGES/DRESSINGS) ×1
DERMABOND ADVANCED .7 DNX12 (GAUZE/BANDAGES/DRESSINGS) ×2 IMPLANT
DRAIN CHANNEL RND F F (WOUND CARE) IMPLANT
DRAPE ARM DVNC X/XI (DISPOSABLE) ×8 IMPLANT
DRAPE COLUMN DVNC XI (DISPOSABLE) ×2 IMPLANT
DRAPE DA VINCI XI ARM (DISPOSABLE) ×4
DRAPE DA VINCI XI COLUMN (DISPOSABLE) ×1
DRAPE SURG IRRIG POUCH 19X23 (DRAPES) ×3 IMPLANT
DRSG TEGADERM 4X4.75 (GAUZE/BANDAGES/DRESSINGS) ×3 IMPLANT
ELECT PENCIL ROCKER SW 15FT (MISCELLANEOUS) IMPLANT
ELECT REM PT RETURN 15FT ADLT (MISCELLANEOUS) ×3 IMPLANT
GAUZE 4X4 16PLY ~~LOC~~+RFID DBL (SPONGE) IMPLANT
GAUZE SPONGE 2X2 8PLY STRL LF (GAUZE/BANDAGES/DRESSINGS) IMPLANT
GLOVE SURG ENC MOIS LTX SZ6.5 (GLOVE) ×3 IMPLANT
GLOVE SURG ENC TEXT LTX SZ7.5 (GLOVE) ×6 IMPLANT
GLOVE SURG UNDER POLY LF SZ7.5 (GLOVE) ×3 IMPLANT
GOWN STRL REUS W/TWL LRG LVL3 (GOWN DISPOSABLE) ×9 IMPLANT
HOLDER FOLEY CATH W/STRAP (MISCELLANEOUS) ×3 IMPLANT
IRRIG SUCT STRYKERFLOW 2 WTIP (MISCELLANEOUS) ×3
IRRIGATION SUCT STRKRFLW 2 WTP (MISCELLANEOUS) ×2 IMPLANT
IV LACTATED RINGERS 1000ML (IV SOLUTION) ×3 IMPLANT
KIT PROCEDURE DA VINCI SI (MISCELLANEOUS) ×1
KIT PROCEDURE DVNC SI (MISCELLANEOUS) ×2 IMPLANT
KIT TURNOVER KIT A (KITS) ×1 IMPLANT
NDL INSUFFLATION 14GA 120MM (NEEDLE) ×2 IMPLANT
NDL SPNL 22GX7 QUINCKE BK (NEEDLE) ×2 IMPLANT
NEEDLE INSUFFLATION 14GA 120MM (NEEDLE) ×3 IMPLANT
NEEDLE SPNL 22GX7 QUINCKE BK (NEEDLE) ×3 IMPLANT
PACK ROBOT UROLOGY CUSTOM (CUSTOM PROCEDURE TRAY) ×3 IMPLANT
PAD POSITIONING PINK XL (MISCELLANEOUS) ×3 IMPLANT
PORT ACCESS TROCAR AIRSEAL 12 (TROCAR) ×2 IMPLANT
PORT ACCESS TROCAR AIRSEAL 5M (TROCAR) ×1
RELOAD STAPLE 45 4.1 GRN THCK (STAPLE) ×2 IMPLANT
SEAL CANN UNIV 5-8 DVNC XI (MISCELLANEOUS) ×8 IMPLANT
SEAL XI 5MM-8MM UNIVERSAL (MISCELLANEOUS) ×4
SET TRI-LUMEN FLTR TB AIRSEAL (TUBING) ×3 IMPLANT
SOLUTION ELECTROLUBE (MISCELLANEOUS) ×3 IMPLANT
SPIKE FLUID TRANSFER (MISCELLANEOUS) ×3 IMPLANT
SPONGE GAUZE 2X2 STER 10/PKG (GAUZE/BANDAGES/DRESSINGS)
SPONGE T-LAP 4X18 ~~LOC~~+RFID (SPONGE) ×3 IMPLANT
STAPLE RELOAD 45 GRN (STAPLE) ×2 IMPLANT
STAPLE RELOAD 45MM GREEN (STAPLE) ×1
SUT ETHILON 3 0 PS 1 (SUTURE) ×3 IMPLANT
SUT MNCRL AB 4-0 PS2 18 (SUTURE) ×6 IMPLANT
SUT PDS AB 1 CT1 27 (SUTURE) ×6 IMPLANT
SUT VIC AB 2-0 SH 27 (SUTURE) ×1
SUT VIC AB 2-0 SH 27X BRD (SUTURE) ×2 IMPLANT
SUT VICRYL 0 UR6 27IN ABS (SUTURE) ×3 IMPLANT
SUT VLOC BARB 180 ABS3/0GR12 (SUTURE) ×9
SUTURE VLOC BRB 180 ABS3/0GR12 (SUTURE) ×6 IMPLANT
SYR 27GX1/2 1ML LL SAFETY (SYRINGE) ×3 IMPLANT
TOWEL OR NON WOVEN STRL DISP B (DISPOSABLE) ×3 IMPLANT
TROCAR XCEL NON-BLD 5MMX100MML (ENDOMECHANICALS) IMPLANT
WATER STERILE IRR 1000ML POUR (IV SOLUTION) ×3 IMPLANT

## 2021-02-24 NOTE — Discharge Instructions (Addendum)

## 2021-02-24 NOTE — Anesthesia Procedure Notes (Signed)
Procedure Name: Intubation Date/Time: 02/24/2021 8:41 AM Performed by: Gean Maidens, CRNA Pre-anesthesia Checklist: Patient identified, Emergency Drugs available, Suction available, Patient being monitored and Timeout performed Patient Re-evaluated:Patient Re-evaluated prior to induction Oxygen Delivery Method: Circle system utilized Preoxygenation: Pre-oxygenation with 100% oxygen Induction Type: IV induction Ventilation: Mask ventilation without difficulty Laryngoscope Size: Mac and 4 Grade View: Grade I Tube type: Oral Tube size: 7.5 mm Number of attempts: 1 Airway Equipment and Method: Stylet Placement Confirmation: ETT inserted through vocal cords under direct vision, positive ETCO2 and breath sounds checked- equal and bilateral Secured at: 23 cm Tube secured with: Tape Dental Injury: Teeth and Oropharynx as per pre-operative assessment

## 2021-02-24 NOTE — Anesthesia Postprocedure Evaluation (Signed)
Anesthesia Post Note  Patient: Alexander Brady  Procedure(s) Performed: XI ROBOTIC ASSISTED LAPAROSCOPIC RADICAL PROSTATECTOMY WITH INDOCYANINE GREEN DYE INJECTION (Pelvis) LYMPH NODE DISSECTION (Bilateral: Pelvis)     Patient location during evaluation: PACU Anesthesia Type: General Level of consciousness: awake and alert Pain management: pain level controlled Vital Signs Assessment: post-procedure vital signs reviewed and stable Respiratory status: spontaneous breathing, nonlabored ventilation, respiratory function stable and patient connected to nasal cannula oxygen Cardiovascular status: blood pressure returned to baseline and stable Postop Assessment: no apparent nausea or vomiting Anesthetic complications: yes Comments: Suspected right corneal abrasion. Eye lubricated with saline drops without improvement. Ketorolac ophthalmic solution added to post-operative orders.    No notable events documented.  Last Vitals:  Vitals:   02/24/21 1330 02/24/21 1432  BP:  (!) 149/83  Pulse:  90  Resp:  20  Temp:  36.6 C  SpO2: 95% 99%    Last Pain:  Vitals:   02/24/21 1536  TempSrc:   PainSc: 8                  Yamil Oelke P Jakia Kennebrew

## 2021-02-24 NOTE — Transfer of Care (Signed)
Immediate Anesthesia Transfer of Care Note  Patient: Alexander Brady  Procedure(s) Performed: XI ROBOTIC ASSISTED LAPAROSCOPIC RADICAL PROSTATECTOMY WITH INDOCYANINE GREEN DYE INJECTION (Pelvis) LYMPH NODE DISSECTION (Bilateral: Pelvis)  Patient Location: PACU  Anesthesia Type:General  Level of Consciousness: sedated, patient cooperative and responds to stimulation  Airway & Oxygen Therapy: Patient Spontanous Breathing and Patient connected to face mask oxygen  Post-op Assessment: Report given to RN and Post -op Vital signs reviewed and stable  Post vital signs: Reviewed and stable  Last Vitals:  Vitals Value Taken Time  BP 155/83 02/24/21 1200  Temp    Pulse 81 02/24/21 1202  Resp 12 02/24/21 1202  SpO2 100 % 02/24/21 1202  Vitals shown include unvalidated device data.  Last Pain:  Vitals:   02/24/21 0647  TempSrc: Oral         Complications: No notable events documented.

## 2021-02-24 NOTE — Progress Notes (Signed)
Ambulation delayed due to vision and eye pain. Pt stated drops are starting to help. Oncoming shift aware to ambulate patient.

## 2021-02-24 NOTE — Progress Notes (Signed)
Once patient arrived to unit pt was assisted to chair, tolerated for aprox 15-20 min. Then assisted back to bed. IVF started, scheduled and prn meds administered. Pain controled. Unable to open eyes due to right eye red, c/o pain. . Eye drop orders reordered. Eye patch requested. Will do eye care, when items arrive. Jp drain sangious. Foley draining with bloody urine. Lap sites wnl. Will attempt to ambulated if vision is improved

## 2021-02-24 NOTE — Brief Op Note (Signed)
02/24/2021  11:47 AM  PATIENT:  Alexander Brady  66 y.o. male  PRE-OPERATIVE DIAGNOSIS:  PROSTATE CANCER  POST-OPERATIVE DIAGNOSIS:  PROSTATE CANCER  PROCEDURE:  Procedure(s): XI ROBOTIC ASSISTED LAPAROSCOPIC RADICAL PROSTATECTOMY WITH INDOCYANINE GREEN DYE INJECTION (N/A) LYMPH NODE DISSECTION (Bilateral)  SURGEON:  Surgeon(s) and Role:    Alexis Frock, MD - Primary  PHYSICIAN ASSISTANT:   ASSISTANTS: Debbrah Alar PA  ANESTHESIA:   local and general  EBL:  150 mL   BLOOD ADMINISTERED:none  DRAINS:  1 - JP to bulb; 2 - Foley to gravity    LOCAL MEDICATIONS USED:  MARCAINE     SPECIMEN:  Source of Specimen:  1 - periprostatic fat; 2 - prostatectomy; 3 - pelvic lymph nodes  DISPOSITION OF SPECIMEN:  PATHOLOGY  COUNTS:  YES  TOURNIQUET:  * No tourniquets in log *  DICTATION: .Other Dictation: Dictation Number [id number]  PLAN OF CARE: Admit for overnight observation  PATIENT DISPOSITION:  PACU - hemodynamically stable.   Delay start of Pharmacological VTE agent (>24hrs) due to surgical blood loss or risk of bleeding: yes

## 2021-02-24 NOTE — H&P (Signed)
Alexander Brady is an 66 y.o. male.    Chief Complaint: Pre-Op Prostatectomy / Node Dissection  HPI:   1 - Moderate Risk Prostate Cancer in Large Prostate- 5/12 cores with mix of grade 1,2 cancer by BX10/2022 on eval rising PSA to 5.3. TRUS 110gm with LARGE median lobe. He is Sales promotion account executive. Brother with stage 4 prostate cancer.   PMH sig for obesity, DM2 (A1c 6s). NO CV disease / blood thinners. He works for [employer] and is former Company secretary, mostly Pharmacist, hospital. His wife Alexander Brady is very involved. His PCP is Alexander Sarah MD with [facility] in [place].   Today " Alexander Brady " is seen to proceed with prostatectomy / node dissection for large volume prostate cancer. No interval fevers. Hgb 14.9. C19 screen negative.   Past Medical History:  Diagnosis Date   Cancer [facility])    prostate   Diabetes mellitus without complication ([place])    Headache     Past Surgical History:  Procedure Laterality Date   COLONOSCOPY N/A 06/15/2017   Procedure: COLONOSCOPY;  Surgeon: Alexander Binder, MD;  Location: [facility];  Service: Endoscopy;  Laterality: N/A;  10:30   NONE      Family History  Problem Relation Age of Onset   Leukemia Mother    Prostate cancer Brother    Colon cancer Neg Hx    Colon polyps Neg Hx    Social History:  reports that he has never smoked. He has never used smokeless tobacco. He reports that he does not drink alcohol and does not use drugs.  Allergies: No Known Allergies  No medications prior to admission.    Results for orders placed or performed during the hospital encounter of 02/22/21 (from the past 48 hour(s))  SARS CORONAVIRUS 2 (TAT 6-24 HRS) Nasopharyngeal Nasopharyngeal Swab     Status: None   Collection Time: 02/22/21  7:01 AM   Specimen: Nasopharyngeal Swab  Result Value Ref Range   SARS Coronavirus 2 NEGATIVE NEGATIVE    Comment: (NOTE) SARS-CoV-2 target nucleic acids are NOT DETECTED.  The SARS-CoV-2 RNA is generally detectable in upper and  lower respiratory specimens during the acute phase of infection. Negative results do not preclude SARS-CoV-2 infection, do not rule out co-infections with other pathogens, and should not be used as the sole basis for treatment or other patient management decisions. Negative results must be combined with clinical observations, patient history, and epidemiological information. The expected result is Negative.  Fact Sheet for Patients: SugarRoll.be  Fact Sheet for Healthcare Providers: https://www.woods-mathews.com/  This test is not yet approved or cleared by the [place] FDA and  has been authorized for detection and/or diagnosis of SARS-CoV-2 by FDA under an Emergency Use Authorization (EUA). This EUA will remain  in effect (meaning this test can be used) for the duration of the COVID-19 declaration under Se ction 564(b)(1) of the Act, 21 U.S.C. section 360bbb-3(b)(1), unless the authorization is terminated or revoked sooner.  Performed at [facility], [place] [address], [place], [place] [address]    No results found.  Review of Systems  Constitutional:  Negative for chills and fever.  All other systems reviewed and are negative.  There were no vitals taken for this visit. Physical Exam Vitals reviewed.  HENT:     Head: Normocephalic.     Nose: Nose normal.  Cardiovascular:     Rate and Rhythm: Normal rate.  Abdominal:     General: Abdomen is flat.  Comments: Stable truncal obesity.   Genitourinary:    Comments: No CVAT Musculoskeletal:        General: Normal range of motion.     Cervical back: Normal range of motion.  Neurological:     General: No focal deficit present.  Psychiatric:        Mood and Affect: Mood normal.     Assessment/Plan  Proceed as planned with prostatetomy / node dissection. Risks, benefits, alternatives, expected peri-op course discussed previously and reiterated today.   Alexis Frock, MD 02/24/2021, 6:13 AM

## 2021-02-24 NOTE — Plan of Care (Signed)
  Problem: Education: Goal: Knowledge of General Education information will improve Description Including pain rating scale, medication(s)/side effects and non-pharmacologic comfort measures Outcome: Progressing   

## 2021-02-25 ENCOUNTER — Encounter (HOSPITAL_COMMUNITY): Payer: Self-pay | Admitting: Urology

## 2021-02-25 DIAGNOSIS — C61 Malignant neoplasm of prostate: Secondary | ICD-10-CM | POA: Diagnosis not present

## 2021-02-25 LAB — CBC
HCT: 38.4 % — ABNORMAL LOW (ref 39.0–52.0)
Hemoglobin: 12.6 g/dL — ABNORMAL LOW (ref 13.0–17.0)
MCH: 32.2 pg (ref 26.0–34.0)
MCHC: 32.8 g/dL (ref 30.0–36.0)
MCV: 98.2 fL (ref 80.0–100.0)
Platelets: 240 10*3/uL (ref 150–400)
RBC: 3.91 MIL/uL — ABNORMAL LOW (ref 4.22–5.81)
RDW: 14 % (ref 11.5–15.5)
WBC: 9.2 10*3/uL (ref 4.0–10.5)
nRBC: 0 % (ref 0.0–0.2)

## 2021-02-25 LAB — BASIC METABOLIC PANEL
Anion gap: 3 — ABNORMAL LOW (ref 5–15)
BUN: 14 mg/dL (ref 8–23)
CO2: 26 mmol/L (ref 22–32)
Calcium: 7.8 mg/dL — ABNORMAL LOW (ref 8.9–10.3)
Chloride: 106 mmol/L (ref 98–111)
Creatinine, Ser: 1.13 mg/dL (ref 0.61–1.24)
GFR, Estimated: >60 mL/min (ref >60)
Glucose, Bld: 101 mg/dL — ABNORMAL HIGH (ref 70–99)
Potassium: 4.1 mmol/L (ref 3.5–5.1)
Sodium: 135 mmol/L (ref 135–145)

## 2021-02-25 LAB — CREATININE, FLUID (PLEURAL, PERITONEAL, JP DRAINAGE): Creat, Fluid: 1.1 mg/dL

## 2021-02-25 LAB — GLUCOSE, CAPILLARY
Glucose-Capillary: 102 mg/dL — ABNORMAL HIGH (ref 70–99)
Glucose-Capillary: 88 mg/dL (ref 70–99)
Glucose-Capillary: 105 mg/dL — ABNORMAL HIGH (ref 70–99)
Glucose-Capillary: 90 mg/dL (ref 70–99)

## 2021-02-25 MED ORDER — PANTOPRAZOLE SODIUM 40 MG IV SOLR
40.0000 mg | INTRAVENOUS | Status: DC
  Administered 2021-02-25: 40 mg via INTRAVENOUS
  Filled 2021-02-25: qty 40

## 2021-02-25 MED ORDER — CHLORHEXIDINE GLUCONATE CLOTH 2 % EX PADS
6.0000 | MEDICATED_PAD | Freq: Every day | CUTANEOUS | Status: DC
  Administered 2021-02-25: 6 via TOPICAL

## 2021-02-25 MED ORDER — CHLORHEXIDINE GLUCONATE CLOTH 2 % EX PADS: 6.0000 | MEDICATED_PAD | Freq: Every day | CUTANEOUS | Status: DC

## 2021-02-25 MED ORDER — SIMETHICONE 80 MG PO CHEW
80.0000 mg | CHEWABLE_TABLET | Freq: Four times a day (QID) | ORAL | Status: DC | PRN
  Administered 2021-02-25: 80 mg via ORAL
  Filled 2021-02-25: qty 1

## 2021-02-25 MED ORDER — ACETAMINOPHEN 500 MG PO TABS
1000.0000 mg | ORAL_TABLET | Freq: Four times a day (QID) | ORAL | Status: DC
  Administered 2021-02-25 – 2021-02-26 (×2): 1000 mg via ORAL
  Filled 2021-02-25 (×3): qty 2

## 2021-02-25 NOTE — Op Note (Signed)
Alexander Brady, Alexander Brady MEDICAL RECORD NO: [id number] ACCOUNT NO: [id number] DATE OF BIRTH: March 08, 1955 FACILITY: [facility] LOCATION: [id number] PHYSICIAN: Alexis Frock, MD  Operative Report   DATE OF PROCEDURE: 02/24/2021  PREOPERATIVE DIAGNOSIS:  Prostate cancer with median lobe anatomy.  PROCEDURES PERFORMED:   1.  Robotic-assisted laparoscopic radical prostatectomy. 2.  Bilateral pelvic lymph node dissection. 3.  Injection of indocyanine green dye.  ESTIMATED BLOOD LOSS:  315 mL  COMPLICATIONS:  None.  SPECIMENS:  1.  Periprostatic fat. 2.  Radical prostatectomy. 3.  Right external iliac lymph nodes. 4.  Right obturator lymph nodes. 5.  Left external iliac lymph nodes, sentinel 6.  Left obturator lymph nodes. 7.  Left perivesical lymph nodes, sentinel   ASSISTANT:  Debbrah Alar, PA  DRAINS:   1.  Jackson-Pratt drain to bulb suction. 2.  Foley catheter to straight drain.  FINDINGS:   1.  Large trilobar prostatic hypertrophy as anticipated. 2.  Sentinel lymph nodes in left pelvis noted on pathology acquisition.  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  He also has a very large prostate at approximately 100 grams with a very significant  median lobe anatomy that is quite obstructive.  Options were discussed for management including surveillance protocols versus ablative therapy versus surgical extirpation.  The patient will proceed with surgery with curative intent.  Informed consent was  obtained and placed in medical record.   DESCRIPTION OF PROCEDURE:  The patient was verified as being himself, procedure being radical prostatectomy and node dissection was confirmed. procedure timeout was performed and intravenous antibiotics administered. General endotracheal anesthesia  induced.  The patient was placed into a low lithotomy position, sterile field was  created, prepped and draped base of the penis, perineum and proximal thighs using iodine and his infraxiphoid using chlorhexidine gluconate.  He was further fastened to the  table using 3-inch tape over foam padding across the supraxiphoid chest.  His arms were tucked to the side with gel roll.  A test of steep Trendelenburg positioning was performed and found to be suitably positioned.  Foley catheter was placed free to  straight drain. Next, a high flow, low pressure pneumoperitoneum was obtained using a Veress technique in the supraumbilical midline having passed the aspiration and drop test.  An 8 mm robotic camera port was then placed in the same location.  Laparoscopic examination of peritoneal cavity revealed no significant adhesions, no visceral injury.  Additional ports were placed as follows:  Right paramedian 8 mm robotic port, right far lateral 12 mm AirSeal assist port.  Right paramedian 5 mm  suction port, left paramedian 8 mm robotic port, left far lateral 8 mm robotic port.  The robot was docked and passed the electronic checks.  Initial attention was directed at development of space of Retzius.  Incision was made lateral to right medial  umbilical ligament from the midline towards the area of the internal ring coursing along the iliac vessels towards the area of the right ureter, which was positively identified. The right vas deferens was encountered, purposely ligated and used as a  medial bucket handle.  The right bladder wall was swept away from the pelvic sidewall towards the area of the endopelvic fascia on the right side.  A mirror image dissection was performed on the left side.  Anterior attachments were taken down with  cautery scissors to expose the anterior base of  the prostate and bladder neck junction was defatted to better denote bladder neck anatomy.  This fat was excised, labeled as periprostatic fat.  Next, 0.2 mL of indocyanine green dye was injected in each  lobe of the  prostate using percutaneously placed robotically guided spinal needle for sentinel lymphangiography.  Next, the endopelvic fascia was swept away from the lateral aspect of the prostate and base to apex orientation bilaterally.  This exposed  the dorsal venous complex and was controlled using green load stapler taking exquisite care to avoid membranous urethral injury.  It had been approximately 10 minutes post-dye injection, the pelvis was inspected under near infrared fluorescence light.  Sentinel lymphangiography revealed excellent parenchymal uptake of the prostate.  Multiple sentinel lymphatic channels coursing towards the lymph node fields, predominantly on the left side.  Standard  template lymphadenectomy was then performed on the  right side starting with a right external iliac group with boundaries being right external iliac artery, vein, pelvic sidewall, iliac bifurcation. Lymphostasis was achieved with cold clips, set aside and labeled as right external lymph nodes.  Next,  right obturator group was dissected free with the boundaries being right external iliac vein, pelvic sidewall and obturator nerve.  Lymphostasis was achieved with cold clips, set aside and labeled as right obturator lymph nodes.   The right obturator  nerve was inspected following the maneuvers and found to be uninjured.  Next, mirror image lymphadenectomy was performed on the left side of the left external iliac and left obturator groups respectively.  These both  sentinel lymph nodes were set  aside for permanent pathology.  Left obturator nerve was inspected following the maneuvers and found to be uninjured.  There was also a very predominant sentinel lymph node in the left perivesical location.  This was dissected free and set aside labeled  as such.  Attention was directed to the bladder neck dissection.  Bladder neck was identified in the midline by moving the Foley catheter back and forth and a lateral release was  performed on each side to better approximate true bladder neck caliber.   The patient did have significant median lobe as expected, which made dissection somewhat challenging, but quite successful.  Having dissected approximately the anterior two-thirds of the bladder neck, the median lobe was delivered through this into the  operative field.  It was quite large as anticipated.  It was manipulated by placing a 0 Vicryl in figure-of-eight stay suture within this. In the posterior aspect, the bladder neck was very carefully tailored and dissected away from this to minimize  caliber. Ureteral orifices were directly visualized and away from the plane of dissection.  Posterior dissection was performed incising roughly 7 mm inferior posterior to the posterior lip of the prostate entering the plane of Denonvilliers.  Bilateral  vas deferens were encountered dissected for a distance of approximately 4 cm, ligated and placed on gentle superior traction.  Bilateral seminal vesicles were dissected to their tip, were placed on gentle superior traction.  Dissection proceeded within  this plane to the area of the apex of the prostate.  This exposed the vascular pedicles, which were controlled using a sequential clipping technique on both sides, performing minimal to moderate nerve sparing as he does have bilateral lateral positivity.   Final apical dissection was performed by placing the prostate on superior traction and approaching from the anterior midline.  The membranous urethra was transected coldly.  This completely freed up the prostate and the specimen was placed  into the  EndoCatch bag for later retrieval.  Next, digital rectal exam was performed using indicator glove under laparoscopic vision and no evidence of rectal violation was noted.  Posterior reconstruction was performed using 3-0 V-Loc suture reapproximating the  posterior urethral plate to the posterior bladder neck, bringing these structures in  tension-free apposition. Next, mucosa-to-mucosa anastomosis was performed using double-armed 3-0 V-Loc suture from the 6 o'clock to 12 o'clock position which showed an  excellent reapproximation in tension-free fashion.  A Foley catheter was then placed, which irrigated quantitatively.  Sponge and needle counts were correct.  Hemostasis was excellent.  A closed suction drain was brought through the previous left lateral  most robotic port site to the peritoneal cavity. Robot was then undocked.  The previous right lateral most assistant port site was closed at the level of fascia using Carter-Thomason suture passer and 0 Vicryl. Specimen was retrieved by extending the  camera port site inferiorly for a total distance of approximately 4 cm, removing the prostatectomy specimen and setting aside for permanent pathology. Extraction site was then closed at the level of fascia using figure-of-eight PDS x4 followed by  reapproximation of Scarpa's with running Vicryl.  All incision sites were infiltrated with diluted lipolyzed Marcaine and closed at the level of the skin using subcuticular Monocryl followed by Dermabond.  The procedure was terminated.  The patient  tolerated the procedure well.  No immediate complications.  The patient was taken to [facility] unit in stable condition.  Plan for observation and admission.  Please note first assistant Debbrah Alar was crucial for all portions of the surgery today. She provided invaluable retraction, suctioning, specimen manipulation, vascular clipping, lymphatic clipping and general assistance.   PAA D: 02/24/2021 11:56:14 am T: 02/25/2021 1:20:00 am  JOB: [id number]/ [id number]

## 2021-02-25 NOTE — Progress Notes (Signed)
Urology Progress Note   1 Day Post-Op from [facility].   Subjective: NAEON.  Difficulty ambulating yesterday due to vision which has now improved.  Vital signs are stable.  2 L urine output from Foley.  200 cc from JP.  JP creatinine is resulted equal to serum creatinine.  Not passing gas yet.  Ambulating today.  Has an appetite.  Objective: Vital signs in last 24 hours: Temp:  [97.9 F (36.6 C)-99 F (37.2 C)] 98.1 F (36.7 C) (02/02 0641) Pulse Rate:  [77-92] 87 (02/02 0641) Resp:  [10-25] 18 (02/02 0641) BP: (116-155)/(68-89) 116/73 (02/02 0641) SpO2:  [94 %-100 %] 95 % (02/02 0641)  Intake/Output from previous day: 02/01 0701 - 02/02 0700 In: 5402.2 [I.V.:4102.2; IV Piggyback:1300] Out: 1350 [Urine:1000; Drains:200; Blood:150] Intake/Output this shift: Total I/O In: -  Out: 700 [Urine:700]  Physical Exam:  General: Alert and oriented CV: Regular rate Lungs: No increased work of breathing Abdomen: Soft, appropriately tender. Incisions c/d/i. JP SS GU: Foley in place draining clear yellow urine  Ext: NT, No erythema  Lab Results: Recent Labs    02/24/21 1241 02/25/21 0452  HGB 13.5 12.6*  HCT 41.3 38.4*   Recent Labs    02/24/21 1241 02/25/21 0452  NA 136 135  K 4.0 4.1  CL 105 106  CO2 25 26  GLUCOSE 150* 101*  BUN 12 14  CREATININE 0.96 1.13  CALCIUM 7.9* 7.8*    Studies/Results: No results found.  Assessment/Plan:  66 y.o. male s/p RALP.  Overall doing well post-op.   -Remove JP drain - Stop IV fluids - Carb consistent diet - Ambulate - Continue Foley catheter.  Provide leg bag and provide leg bag teaching - As needed oral analgesics - Sliding scale insulin  Dispo: Potentially home today versus tomorrow   LOS: 0 days

## 2021-02-25 NOTE — Discharge Summary (Signed)
Date of admission: 02/24/2021  Date of discharge: 02/26/2021  Admission diagnosis: Prostate Cancer  Discharge diagnosis: Prostate Cancer  History and Physical: For full details, please see admission history and physical. Briefly, Alexander Brady is a 66 y.o. gentleman with localized prostate cancer.  After discussing management/treatment options, he elected to proceed with surgical treatment.  Hospital Course: Stephanos Fan was taken to the operating room on 02/24/2021 and underwent a robotic assisted laparoscopic radical prostatectomy. He tolerated this procedure well and without complications. Postoperatively, he was able to be transferred to a regular hospital room following recovery from anesthesia.  He was able to begin ambulating the night of surgery. He remained hemodynamically stable overnight.  He had excellent urine output with appropriately minimal output from his pelvic drain and his pelvic drain was removed on POD #1.  He was transitioned to oral pain medication, tolerated a clear liquid diet, and had met all discharge criteria and was able to be discharged home later on POD#2.  Laboratory values:  Recent Labs    02/24/21 1241 02/25/21 0452  HGB 13.5 12.6*  HCT 41.3 38.4*    Disposition: Home  Discharge instruction: He was instructed to be ambulatory but to refrain from heavy lifting, strenuous activity, or driving. He was instructed on urethral catheter care.  Discharge medications:   Allergies as of 02/26/2021   No Known Allergies      Medication List     STOP taking these medications    aspirin 81 MG EC tablet   multivitamin with minerals tablet       TAKE these medications    docusate sodium 100 MG capsule Commonly known as: COLACE Take 1 capsule (100 mg total) by mouth 2 (two) times daily.   HYDROcodone-acetaminophen 5-325 MG tablet Commonly known as: Norco Take 1-2 tablets by mouth every 6 (six) hours as needed for moderate pain.   metFORMIN 1000 MG  tablet Commonly known as: GLUCOPHAGE Take 1,000 mg by mouth 2 (two) times daily.   sulfamethoxazole-trimethoprim 800-160 MG tablet Commonly known as: BACTRIM DS Take 1 tablet by mouth 2 (two) times daily. Start the day prior to foley removal appointment        Followup: He will followup in 1 week for catheter removal and to discuss his surgical pathology results.

## 2021-02-26 DIAGNOSIS — C61 Malignant neoplasm of prostate: Secondary | ICD-10-CM | POA: Diagnosis not present

## 2021-02-26 LAB — GLUCOSE, CAPILLARY: Glucose-Capillary: 91 mg/dL (ref 70–99)

## 2021-02-26 NOTE — Progress Notes (Signed)
IV discontinued, reviewed AVS instructions with patient and wife, they verbalize understanding.  Changed foley to leg bag and showed wife/patient how to change and empty the bag.  To lobby via wheelchair.

## 2021-03-03 LAB — SURGICAL PATHOLOGY

## 2021-03-30 ENCOUNTER — Telehealth: Payer: Self-pay | Admitting: *Deleted

## 2021-05-06 ENCOUNTER — Telehealth: Payer: Self-pay | Admitting: *Deleted

## 2021-05-07 ENCOUNTER — Encounter: Payer: Self-pay | Admitting: *Deleted

## 2021-05-07 ENCOUNTER — Inpatient Hospital Stay: Payer: Managed Care, Other (non HMO) | Attending: Adult Health | Admitting: *Deleted

## 2021-05-07 DIAGNOSIS — C61 Malignant neoplasm of prostate: Secondary | ICD-10-CM

## 2021-05-07 NOTE — Progress Notes (Signed)
?  2 Identifiers were used for verification purpose only. No vital signs were taken as this was a telephone visit. Pt did mention that he has scrotal pain intermittently rated a 3/10. He describes it as a dull, achy pain.Allergies and medications reconciled. Pt is only getting up at night once to urinate. Pt states he has more urine frequency in the daytime at work,but is not having to wear the incontinence pads as much. Urine flow, he says, is "fast and quick." ?He feels like he is emptying bladder completely when voiding. Pt did express concerns about ED and will talk to Dr. Tresa Moore at his upcoming appt. in May.Pt continues to take OTC stool softeners weekly as needed to prevent constipation. Pt exercises by walking 3.5 miles 3 times weekly. Pt will see PCP for annual physical next Friday at [facility]. Last colonoscopy was 2019. Recall date will be 2029. Vaccinations are updated. SCP reviewed and completed. ?

## 2022-07-24 ENCOUNTER — Other Ambulatory Visit: Payer: Self-pay

## 2022-07-24 ENCOUNTER — Emergency Department (HOSPITAL_COMMUNITY): Payer: No Typology Code available for payment source

## 2022-07-24 ENCOUNTER — Emergency Department (HOSPITAL_COMMUNITY)
Admission: EM | Admit: 2022-07-24 | Discharge: 2022-07-24 | Disposition: A | Discharge status: Home / Self Care | Payer: No Typology Code available for payment source | Attending: Emergency Medicine | Admitting: Emergency Medicine

## 2022-07-24 DIAGNOSIS — L731 Pseudofolliculitis barbae: Secondary | ICD-10-CM | POA: Insufficient documentation

## 2022-07-24 DIAGNOSIS — Z8546 Personal history of malignant neoplasm of prostate: Secondary | ICD-10-CM | POA: Diagnosis not present

## 2022-07-24 DIAGNOSIS — R519 Headache, unspecified: Secondary | ICD-10-CM | POA: Insufficient documentation

## 2022-07-24 DIAGNOSIS — E119 Type 2 diabetes mellitus without complications: Secondary | ICD-10-CM | POA: Diagnosis not present

## 2022-07-24 MED ORDER — DOXYCYCLINE HYCLATE 100 MG PO TABS
100.0000 mg | ORAL_TABLET | Freq: Once | ORAL | Status: AC
  Administered 2022-07-24: 100 mg via ORAL
  Filled 2022-07-24: qty 1

## 2022-07-24 MED ORDER — PROCHLORPERAZINE MALEATE 10 MG PO TABS
10.0000 mg | ORAL_TABLET | Freq: Once | ORAL | Status: AC
  Administered 2022-07-24: 10 mg via ORAL
  Filled 2022-07-24 (×2): qty 1

## 2022-07-24 MED ORDER — ACETAMINOPHEN 500 MG PO TABS
1000.0000 mg | ORAL_TABLET | Freq: Once | ORAL | Status: AC
Start: 2022-07-24 — End: 2022-07-24
  Administered 2022-07-24: 1000 mg via ORAL
  Filled 2022-07-24: qty 2

## 2022-07-24 MED ORDER — DOXYCYCLINE HYCLATE 100 MG PO CAPS: 100.0000 mg | ORAL_CAPSULE | Freq: Two times a day (BID) | ORAL | 0 refills | Status: AC

## 2022-07-24 NOTE — ED Provider Notes (Signed)
[place] EMERGENCY DEPARTMENT AT [facility] Provider Note   CSN: [id number] Arrival date & time: 07/24/22  1616     History {Add pertinent medical, surgical, social history, OB history to HPI:1} Chief Complaint  Patient presents with   Headache    Hawken Bartholf is a 67 y.o. male history of prostate cancer status post prostatectomy, history of diabetes presented with headache that began 2 days ago.  Patient states headache is on both sides of his head but denies any vision changes, weakness, neck pain.  Patient denies any fevers as well.  Headache is constant and nonpositional.  Patient is tried Tylenol once to no relief.  Patient states this headache feels like previous headaches.  Patient states he has ingrown hairs around his neck.  Patient states that he is a scar from previous surgery done a long time ago and will get ingrown hairs every now and again.  Patient notes that there have been some purulent drainage but no fevers.  Patient denies any chest pain, shortness of breath, LOC, blood thinners  Home Medications Prior to Admission medications   Medication Sig Start Date End Date Taking? Authorizing Provider  docusate sodium (COLACE) 100 MG capsule Take 1 capsule (100 mg total) by mouth 2 (two) times daily. Patient taking differently: Take 100 mg by mouth once a week. Pt takes weekly as needed. 02/24/21   Harrie Foreman, PA-C  HYDROcodone-acetaminophen (NORCO) 5-325 MG tablet Take 1-2 tablets by mouth every 6 (six) hours as needed for moderate pain. Patient not taking: Reported on 05/07/2021 02/24/21   Harrie Foreman, PA-C  metFORMIN (GLUCOPHAGE) 1000 MG tablet Take 1,000 mg by mouth 2 (two) times daily. 07/31/20   [provider]      Allergies    Patient has no known allergies.    Review of Systems   Review of Systems  Neurological:  Positive for headaches.    Physical Exam Updated Vital Signs BP 132/89   Pulse 70   Temp 98.4 F (36.9 C)   Resp 18   Ht  5\' 7"  (1.702 m)   Wt 88.5 kg   SpO2 98%   BMI 30.54 kg/m  Physical Exam Vitals reviewed.  Constitutional:      General: He is not in acute distress. HENT:     Head: Normocephalic and atraumatic.  Eyes:     Extraocular Movements: Extraocular movements intact.     Conjunctiva/sclera: Conjunctivae normal.     Pupils: Pupils are equal, round, and reactive to light.  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     Pulses: Normal pulses.     Heart sounds: Normal heart sounds.     Comments: 2+ bilateral radial/dorsalis pedis pulses with regular rate Pulmonary:     Effort: Pulmonary effort is normal. No respiratory distress.     Breath sounds: Normal breath sounds.  Abdominal:     Palpations: Abdomen is soft.     Tenderness: There is no abdominal tenderness. There is no guarding or rebound.  Musculoskeletal:        General: Normal range of motion.     Cervical back: Normal range of motion and neck supple. No rigidity.     Comments: 5 out of 5 bilateral grip/leg extension strength  Skin:    General: Skin is  warm and dry.     Capillary Refill: Capillary refill takes less than 2 seconds.  Neurological:     General: No focal deficit present.     Mental Status: He is alert and oriented to person, place, and time.     Motor: Motor function is intact.     Coordination: Coordination is intact.     Comments: Sensation intact in all 4 limbs Vision grossly intact Cranial nerves III through XII intact  Psychiatric:        Mood and Affect: Mood normal.     ED Results / Procedures / Treatments   Labs (all labs ordered are listed, but only abnormal results are displayed) Labs Reviewed - No data to display  EKG None  Radiology No results found.  Procedures Procedures  {Document cardiac monitor, telemetry assessment  procedure when appropriate:1}  Medications Ordered in ED Medications - No data to display  ED Course/ Medical Decision Making/ A&P   {   Click here for ABCD2, HEART and other calculatorsREFRESH Note before signing :1}                          Medical Decision Making  Yuki Frelich 67 y.o. presented today for ***. Working DDx that I considered at this time includes, but not limited to, tension headache, migraine intracranial mass, intracranial hemorrhage, intracranial infection including meningitis vs encephalitis, GCA, trigeminal neuralgia.  R/o DDx: ***:  less likely due to history of present illness and physical exam findings Migraine: These are considered less likely due to history of present illness and physical exam findings SAH/ICH: Timeline and slow onset is not consistent with SAH/ICH  GCA: Age and description of pain is not consistent with GCA  Meningitis/encephalitis: Lack of fever,meningismus is not consistent Intracranial Mass:  less likely due to history of present illness and physical exam findings Trigeminal Neuralgia: headache does not meet this description Intracranial Infection:  less likely due to history of present illness and physical exam findings, no fevers  Review of prior external notes: ***  Unique Tests and My Interpretation:  CT Head w/o Contrast: ***  Discussion with Independent Historian: {historian:29369}  Discussion of Management of Tests: {historian:29369}  Risk: {Risk:29370}  Risk Stratification Score: none  Plan: On exam patient was in no acute distress with stable vitals. Physical exam was reassuring however they did note to be ingrown hairs along keloid in patient's neck.  No areas of fluctuance or purulent drainage was noted or erythema.  Due to patient's history of prostate cancer and age a CT head was ordered. Patient will be given Compazine and Tylenol for HA treatment. Patient stable at this time.  Patient was given return  precautions. Patient stable for discharge at this time.  Patient verbalized understanding of plan.   {Document critical care time when appropriate:1} {Document review of labs and clinical decision tools ie heart score, Chads2Vasc2 etc:1}  {Document your independent review of radiology images, and any outside records:1} {Document your discussion with family members, caretakers, and with consultants:1} {Document social determinants of health affecting pt's care:1} {Document your decision making why or why not admission, treatments were needed:1} Final Clinical Impression(s) / ED Diagnoses Final diagnoses:  None    Rx / DC Orders ED Discharge Orders     None

## 2022-07-24 NOTE — ED Notes (Signed)
Patient verbalizes understanding of discharge instructions. Opportunity for questioning and answers were provided. Armband removed by staff, pt discharged from ED. Ambulated out to lobby with daughter  

## 2022-07-24 NOTE — ED Triage Notes (Signed)
Pt ambulatory to triage with c/o headache for last 2 days.  Denies n/v, neuro symptoms or fever.  States BP at home was WNL.  Pt also reports ingrown hair on his neck.

## 2022-07-24 NOTE — Discharge Instructions (Addendum)
Please pick up the antibiotics I have prescribed for you for your ingrown hairs.  Please follow-up with your primary care provider regarding recent symptoms and ER visit.  Today your CT head was normal but does show that you may have some irritation in your sinuses causing your headache.  You may take Tylenol every 6 hours as needed for pain.  If symptoms change or worsen please return to ER.

## 2023-01-07 IMAGING — MR MR PROSTATE WO/W CM
12 series · 48 of 48 positions shown · IV contrast (gadavist)
Comparison: None.

CLINICAL DATA: Newly diagnosed prostate cancer

EXAM:
MR PROSTATE WITHOUT AND WITH CONTRAST
TECHNIQUE: Multiplanar multisequence MRI images were obtained of the pelvis
centered about the prostate. Pre and post contrast images were
obtained.
CONTRAST:  9mL GADAVIST GADOBUTROL 1 MMOL/ML IV SOLN

[Series 3: T1 · axial · 5.0mm · 1.19mm/px · 1 of 72 slices shown (1 of 2)]
[im 1/72]
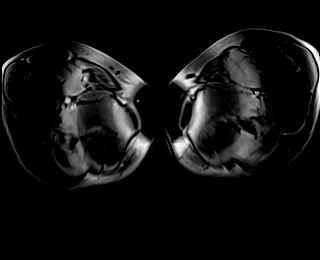

[Series 4: T1 · axial · 5.0mm · 1.19mm/px · 1 of 72 slices shown (2 of 2)]
[im 1/72]
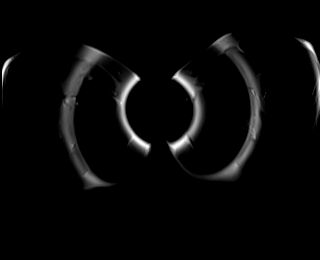

[Series 5: T2 · axial · 3.0mm · 0.47mm/px · 1 of 32 slices shown (1 of 3)]
[im 1/32]
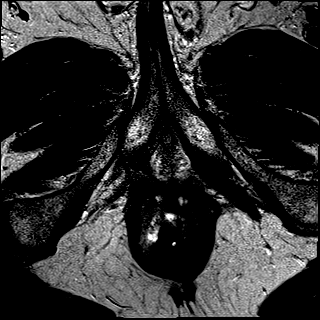

[Series 6: T2 · coronal · 3.0mm · 0.47mm/px · 1 of 26 slices shown (2 of 3)]
[im 1/26]
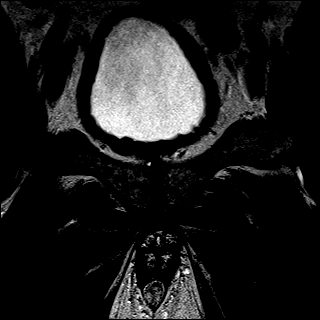

[Series 7: T2 · axial · 1.0mm · 1.00mm/px · z∈[-32,+55]mm · 2 of 88 slices shown (3 of 3)]
[im 1/88]
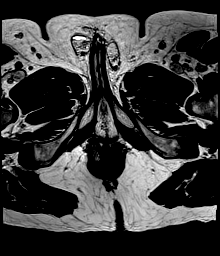
[im 88/88]
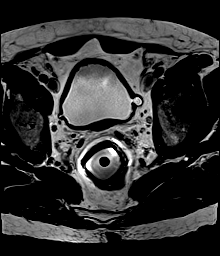

[Series 8: ep2d_diff_[id number]_tra_endo**_tracew_dfc_mix · axial · 3.0mm · 1.60mm/px · z∈[-25,+62]mm · 2 of 90 slices shown]
[im 1/90]
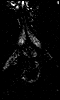
[im 90/90]
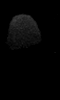

[Series 9: ep2d_diff_b100_500_800_tra_endo**_adc_dfc_mix · axial · 3.0mm · 1.60mm/px · 1 of 28 slices shown]
[im 1/28]
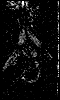

[Series 10: ep2d_diff_b100_500_800_tra_endo**_calc_bval_dfc_mix · axial · 3.0mm · 1.60mm/px · 1 of 30 slices shown]
[im 1/30]
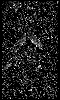

[Series 11: ep2d_diff_bvalue (id) · axial · 3.0mm · 1.60mm/px · 1 of 30 slices shown]
[im 1/30]
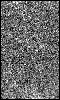

[Series 12: axial multiphase · axial · 3.0mm · 0.98mm/px · z∈[-37,+67]mm · 17 of 720 slices shown]
[im 1/720]
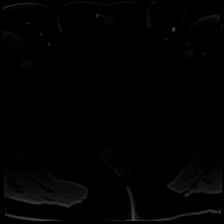
[im 45/720]
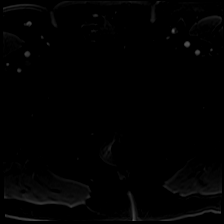
[im 90/720]
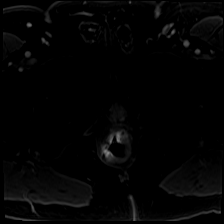
[im 135/720]
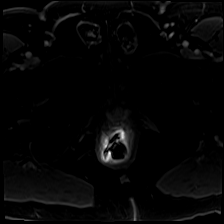
[im 180/720]
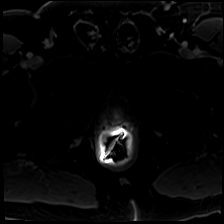
[im 225/720]
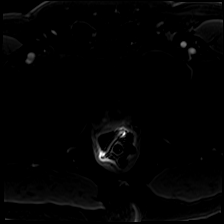
[im 270/720]
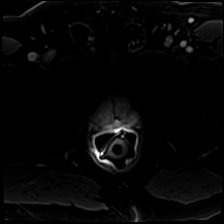
[im 315/720]
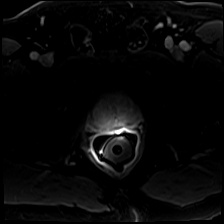
[im 360/720]
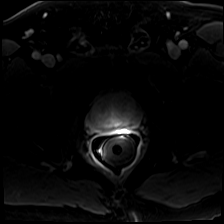
[im 405/720]
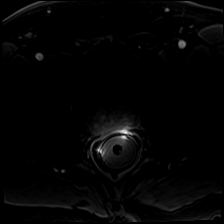
[im 450/720]
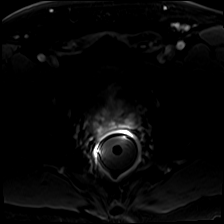
[im 495/720]
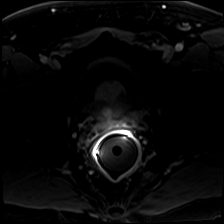
[im 540/720]
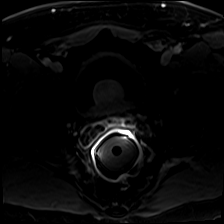
[im 585/720]
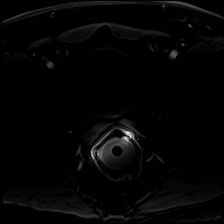
[im 630/720]
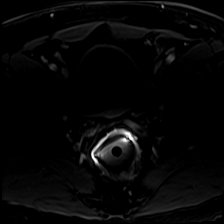
[im 675/720]
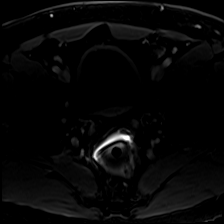
[im 720/720]
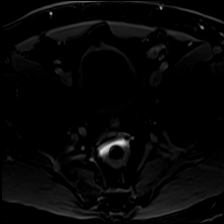

[Series 13: axial multiphase_sub · axial · 3.0mm · 0.98mm/px · z∈[-37,+67]mm · 17 of 684 slices shown]
[im 1/684]
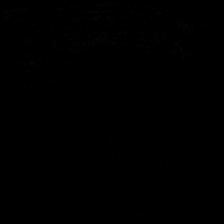
[im 43/684]
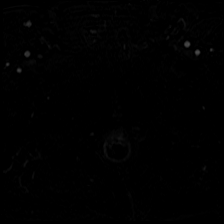
[im 86/684]
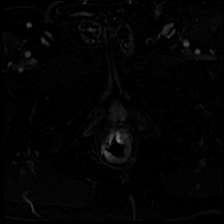
[im 129/684]
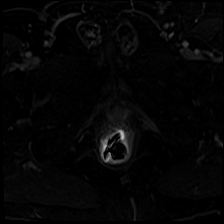
[im 171/684]
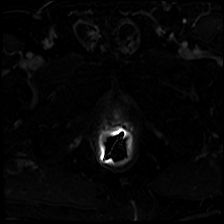
[im 214/684]
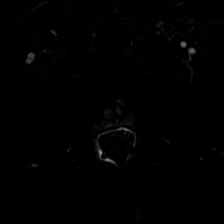
[im 257/684]
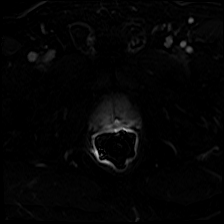
[im 299/684]
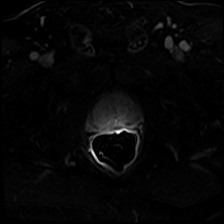
[im 342/684]
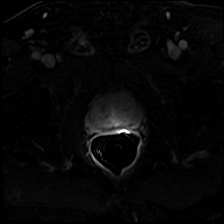
[im 385/684]
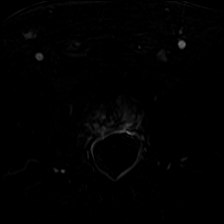
[im 427/684]
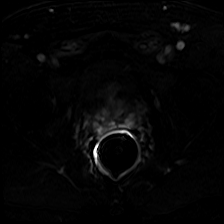
[im 470/684]
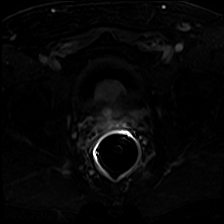
[im 513/684]
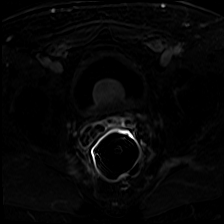
[im 555/684]
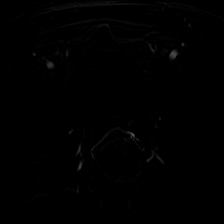
[im 598/684]
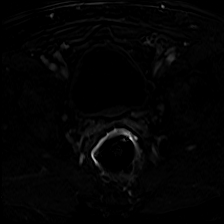
[im 641/684]
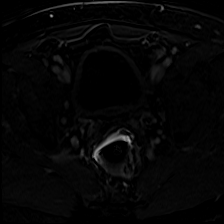
[im 684/684]
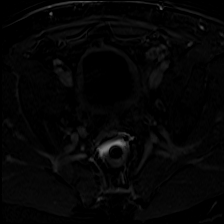

[Series 15: iliac crest thru · axial · 2.5mm · 1.19mm/px · z∈[-78,+178]mm · 3 of 104 slices shown]
[im 1/104]
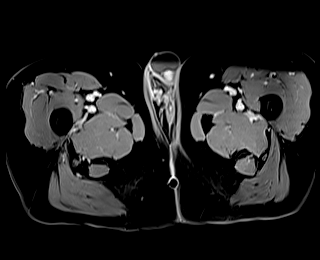
[im 52/104]
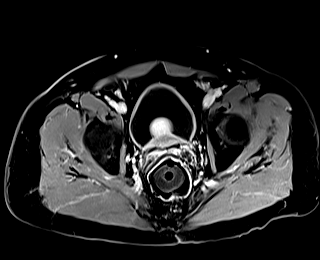
[im 104/104]
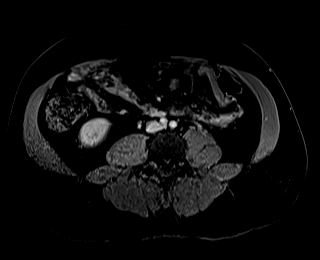

[48 of 48 positions shown; findings below may reference images not displayed]

FINDINGS: Prostate: 12 x 4 x 9 mm lesion at the extreme right apex (series
5/image 14). 13 x 6 x 10 mm lesion at the left posterolateral mid
gland (series 5/image 17). 12 x 4 x 11 mm lesion at the left
posterolateral base of the peripheral zone (series 5/image 22). All
3 lesions correspond to known high-grade prostate cancer on recent
biopsy.

Enlargement/nodularity of the central gland, indenting the base of
the bladder, suggesting BPH. No suspicious central gland nodule on
T2.

Volume: 5.6 x 4.2 x 4.9 cm (calculated volume 60.6 mL).

Transcapsular spread: Lesion in the left posterolateral base abuts
the capsule (series 5/image 22) but does not demonstrate gross
extracapsular extension.

Seminal vesicle involvement: Absent.

Neurovascular bundle involvement: Absent.

Pelvic adenopathy: Small right pelvic sidewall nodes measuring up to
5 mm short axis (series 5/images 2 and 3). Small bilateral inguinal
nodes measuring up to 6 mm short axis on the left (series 5/image
17). These are favored to be reactive.

Bone metastasis: Absent.

Other findings: Thick-walled, trabeculated bladder, likely related
to chronic bladder outlet obstruction.
IMPRESSION: At least 3 bilateral lesions corresponding to the patient's known
high-grade macroscopic prostate cancer, as above.

No findings to suggest extracapsular extension, seminal vesicle
invasion, or metastatic disease.

## 2023-08-28 DIAGNOSIS — Z6834 Body mass index (BMI) 34.0-34.9, adult: Secondary | ICD-10-CM | POA: Diagnosis not present

## 2023-08-28 DIAGNOSIS — Z008 Encounter for other general examination: Secondary | ICD-10-CM | POA: Diagnosis not present

## 2023-08-28 DIAGNOSIS — E1169 Type 2 diabetes mellitus with other specified complication: Secondary | ICD-10-CM | POA: Diagnosis not present

## 2023-08-28 DIAGNOSIS — Z8546 Personal history of malignant neoplasm of prostate: Secondary | ICD-10-CM | POA: Diagnosis not present

## 2023-08-28 DIAGNOSIS — E785 Hyperlipidemia, unspecified: Secondary | ICD-10-CM | POA: Diagnosis not present

## 2023-08-28 DIAGNOSIS — F325 Major depressive disorder, single episode, in full remission: Secondary | ICD-10-CM | POA: Diagnosis not present

## 2023-08-28 DIAGNOSIS — E669 Obesity, unspecified: Secondary | ICD-10-CM | POA: Diagnosis not present

## 2023-10-05 DIAGNOSIS — Z6834 Body mass index (BMI) 34.0-34.9, adult: Secondary | ICD-10-CM | POA: Diagnosis not present

## 2023-10-05 DIAGNOSIS — Z23 Encounter for immunization: Secondary | ICD-10-CM | POA: Diagnosis not present

## 2023-10-05 DIAGNOSIS — R7303 Prediabetes: Secondary | ICD-10-CM | POA: Diagnosis not present

## 2023-10-05 DIAGNOSIS — Z8546 Personal history of malignant neoplasm of prostate: Secondary | ICD-10-CM | POA: Diagnosis not present

## 2023-10-05 DIAGNOSIS — E785 Hyperlipidemia, unspecified: Secondary | ICD-10-CM | POA: Diagnosis not present

## 2023-10-12 DIAGNOSIS — Z1329 Encounter for screening for other suspected endocrine disorder: Secondary | ICD-10-CM | POA: Diagnosis not present

## 2023-10-12 DIAGNOSIS — E559 Vitamin D deficiency, unspecified: Secondary | ICD-10-CM | POA: Diagnosis not present

## 2023-10-12 DIAGNOSIS — Z130 Encounter for screening for diseases of the blood and blood-forming organs and certain disorders involving the immune mechanism: Secondary | ICD-10-CM | POA: Diagnosis not present

## 2023-10-12 DIAGNOSIS — R7303 Prediabetes: Secondary | ICD-10-CM | POA: Diagnosis not present

## 2023-10-12 DIAGNOSIS — E785 Hyperlipidemia, unspecified: Secondary | ICD-10-CM | POA: Diagnosis not present

## 2023-10-19 DIAGNOSIS — Z1331 Encounter for screening for depression: Secondary | ICD-10-CM | POA: Diagnosis not present

## 2023-10-19 DIAGNOSIS — E785 Hyperlipidemia, unspecified: Secondary | ICD-10-CM | POA: Diagnosis not present

## 2023-10-19 DIAGNOSIS — R4582 Worries: Secondary | ICD-10-CM | POA: Diagnosis not present

## 2023-10-19 DIAGNOSIS — Z8546 Personal history of malignant neoplasm of prostate: Secondary | ICD-10-CM | POA: Diagnosis not present

## 2023-10-19 DIAGNOSIS — Z0001 Encounter for general adult medical examination with abnormal findings: Secondary | ICD-10-CM | POA: Diagnosis not present

## 2023-10-19 DIAGNOSIS — R7303 Prediabetes: Secondary | ICD-10-CM | POA: Diagnosis not present

## 2023-10-19 DIAGNOSIS — E049 Nontoxic goiter, unspecified: Secondary | ICD-10-CM | POA: Diagnosis not present

## 2023-10-19 DIAGNOSIS — Z6834 Body mass index (BMI) 34.0-34.9, adult: Secondary | ICD-10-CM | POA: Diagnosis not present

## 2023-10-20 ENCOUNTER — Other Ambulatory Visit: Payer: Self-pay | Admitting: Internal Medicine

## 2023-10-20 DIAGNOSIS — E049 Nontoxic goiter, unspecified: Secondary | ICD-10-CM

## 2023-10-24 ENCOUNTER — Other Ambulatory Visit

## 2023-10-25 ENCOUNTER — Inpatient Hospital Stay: Admission: RE | Admit: 2023-10-25 | Discharge: 2023-10-25 | Attending: Internal Medicine | Admitting: Internal Medicine

## 2023-10-25 DIAGNOSIS — E049 Nontoxic goiter, unspecified: Secondary | ICD-10-CM | POA: Diagnosis not present

## 2023-10-31 DIAGNOSIS — H01004 Unspecified blepharitis left upper eyelid: Secondary | ICD-10-CM | POA: Diagnosis not present

## 2023-10-31 DIAGNOSIS — H01005 Unspecified blepharitis left lower eyelid: Secondary | ICD-10-CM | POA: Diagnosis not present

## 2023-10-31 DIAGNOSIS — H26491 Other secondary cataract, right eye: Secondary | ICD-10-CM | POA: Diagnosis not present

## 2023-10-31 DIAGNOSIS — Z961 Presence of intraocular lens: Secondary | ICD-10-CM | POA: Diagnosis not present

## 2023-10-31 DIAGNOSIS — H01001 Unspecified blepharitis right upper eyelid: Secondary | ICD-10-CM | POA: Diagnosis not present

## 2023-10-31 DIAGNOSIS — H11153 Pinguecula, bilateral: Secondary | ICD-10-CM | POA: Diagnosis not present

## 2023-10-31 DIAGNOSIS — H01002 Unspecified blepharitis right lower eyelid: Secondary | ICD-10-CM | POA: Diagnosis not present

## 2023-10-31 DIAGNOSIS — H2640 Unspecified secondary cataract: Secondary | ICD-10-CM | POA: Diagnosis not present

## 2023-11-14 DIAGNOSIS — C61 Malignant neoplasm of prostate: Secondary | ICD-10-CM | POA: Diagnosis not present

## 2023-11-14 DIAGNOSIS — N5201 Erectile dysfunction due to arterial insufficiency: Secondary | ICD-10-CM | POA: Diagnosis not present

## 2023-11-14 DIAGNOSIS — N393 Stress incontinence (female) (male): Secondary | ICD-10-CM | POA: Diagnosis not present
